# Patient Record
Sex: Male | Born: 1940 | Race: White | Hispanic: No | Smoking: Never smoker
Health system: Southern US, Community
[De-identification: ages and names within clinical notes are randomized; demographics above are authoritative.]

## PROBLEM LIST (undated history)

## (undated) DIAGNOSIS — R0989 Other specified symptoms and signs involving the circulatory and respiratory systems: Secondary | ICD-10-CM

## (undated) DIAGNOSIS — G4730 Sleep apnea, unspecified: Secondary | ICD-10-CM

## (undated) DIAGNOSIS — R209 Unspecified disturbances of skin sensation: Secondary | ICD-10-CM

## (undated) DIAGNOSIS — Z86010 Personal history of colonic polyps: Secondary | ICD-10-CM

## (undated) DIAGNOSIS — G20 Parkinson's disease: Secondary | ICD-10-CM

## (undated) DIAGNOSIS — I82409 Acute embolism and thrombosis of unspecified deep veins of unspecified lower extremity: Secondary | ICD-10-CM

## (undated) DIAGNOSIS — R0609 Other forms of dyspnea: Secondary | ICD-10-CM

## (undated) DIAGNOSIS — Z7901 Long term (current) use of anticoagulants: Secondary | ICD-10-CM

## (undated) DIAGNOSIS — G609 Hereditary and idiopathic neuropathy, unspecified: Secondary | ICD-10-CM

## (undated) DIAGNOSIS — Z9889 Other specified postprocedural states: Secondary | ICD-10-CM

## (undated) DIAGNOSIS — IMO0001 Reserved for inherently not codable concepts without codable children: Secondary | ICD-10-CM

## (undated) DIAGNOSIS — M109 Gout, unspecified: Secondary | ICD-10-CM

## (undated) DIAGNOSIS — M79609 Pain in unspecified limb: Secondary | ICD-10-CM

## (undated) DIAGNOSIS — R32 Unspecified urinary incontinence: Secondary | ICD-10-CM

## (undated) DIAGNOSIS — N400 Enlarged prostate without lower urinary tract symptoms: Secondary | ICD-10-CM

## (undated) DIAGNOSIS — E7800 Pure hypercholesterolemia, unspecified: Secondary | ICD-10-CM

## (undated) DIAGNOSIS — I6529 Occlusion and stenosis of unspecified carotid artery: Secondary | ICD-10-CM

## (undated) DIAGNOSIS — Z973 Presence of spectacles and contact lenses: Secondary | ICD-10-CM

## (undated) DIAGNOSIS — E1165 Type 2 diabetes mellitus with hyperglycemia: Secondary | ICD-10-CM

## (undated) DIAGNOSIS — R6889 Other general symptoms and signs: Secondary | ICD-10-CM

## (undated) DIAGNOSIS — I10 Essential (primary) hypertension: Secondary | ICD-10-CM

## (undated) DIAGNOSIS — Z23 Encounter for immunization: Secondary | ICD-10-CM

## (undated) DIAGNOSIS — R195 Other fecal abnormalities: Secondary | ICD-10-CM

## (undated) DIAGNOSIS — R112 Nausea with vomiting, unspecified: Secondary | ICD-10-CM

## (undated) HISTORY — PX: SKIN BIOPSY: SHX1

## (undated) HISTORY — DX: Other specified symptoms and signs involving the circulatory and respiratory systems: R09.89

## (undated) HISTORY — DX: Unspecified disturbances of skin sensation: R20.9

## (undated) HISTORY — DX: Essential (primary) hypertension: I10

## (undated) HISTORY — DX: Long term (current) use of anticoagulants: Z79.01

## (undated) HISTORY — DX: Gout, unspecified: M10.9

## (undated) HISTORY — DX: Personal history of colonic polyps: Z86.010

## (undated) HISTORY — DX: Other specified symptoms and signs involving the circulatory and respiratory systems: R06.09

## (undated) HISTORY — DX: Pain in unspecified limb: M79.609

## (undated) HISTORY — DX: Hereditary and idiopathic neuropathy, unspecified: G60.9

## (undated) HISTORY — DX: Benign prostatic hyperplasia without lower urinary tract symptoms: N40.0

## (undated) HISTORY — DX: Other general symptoms and signs: R68.89

## (undated) HISTORY — DX: Unspecified urinary incontinence: R32

## (undated) HISTORY — DX: Other fecal abnormalities: R19.5

## (undated) HISTORY — DX: Encounter for immunization: Z23

## (undated) HISTORY — PX: LITHOTRIPSY: SUR834

## (undated) HISTORY — DX: Acute embolism and thrombosis of unspecified deep veins of unspecified lower extremity: I82.409

## (undated) HISTORY — DX: Parkinson's disease: G20

## (undated) HISTORY — DX: Pure hypercholesterolemia, unspecified: E78.00

## (undated) HISTORY — DX: Reserved for inherently not codable concepts without codable children: IMO0001

## (undated) HISTORY — DX: Type 2 diabetes mellitus with hyperglycemia: E11.65

## (undated) HISTORY — DX: Occlusion and stenosis of unspecified carotid artery: I65.29

---

## 1998-12-08 HISTORY — PX: CYSTOSCOPY W/ STONE MANIPULATION: SHX1427

## 2007-06-08 HISTORY — PX: UMBILICAL HERNIA REPAIR: SHX196

## 2008-04-24 ENCOUNTER — Encounter: Admission: RE | Admit: 2008-04-24 | Discharge: 2008-04-24 | Payer: Self-pay | Admitting: Family Medicine

## 2008-10-18 ENCOUNTER — Encounter: Admission: RE | Admit: 2008-10-18 | Discharge: 2008-10-18 | Payer: Self-pay | Admitting: Family Medicine

## 2009-04-17 ENCOUNTER — Encounter: Admission: RE | Admit: 2009-04-17 | Discharge: 2009-04-17 | Payer: Self-pay | Admitting: Family Medicine

## 2009-12-08 HISTORY — PX: LUMBAR SPINE SURGERY: SHX701

## 2010-09-13 ENCOUNTER — Inpatient Hospital Stay (HOSPITAL_COMMUNITY): Admission: RE | Admit: 2010-09-13 | Discharge: 2010-09-20 | Payer: Self-pay | Admitting: Orthopaedic Surgery

## 2010-09-21 ENCOUNTER — Inpatient Hospital Stay (HOSPITAL_COMMUNITY): Admission: EM | Admit: 2010-09-21 | Discharge: 2010-09-26 | Payer: Self-pay | Admitting: Emergency Medicine

## 2011-02-19 LAB — BASIC METABOLIC PANEL
BUN: 11 mg/dL (ref 6–23)
BUN: 16 mg/dL (ref 6–23)
CO2: 22 mEq/L (ref 19–32)
CO2: 23 mEq/L (ref 19–32)
CO2: 23 mEq/L (ref 19–32)
CO2: 24 mEq/L (ref 19–32)
Calcium: 7.6 mg/dL — ABNORMAL LOW (ref 8.4–10.5)
Chloride: 104 mEq/L (ref 96–112)
Chloride: 98 mEq/L (ref 96–112)
Creatinine, Ser: 1.09 mg/dL (ref 0.4–1.5)
GFR calc Af Amer: >60 mL/min (ref >60)
GFR calc Af Amer: >60 mL/min (ref >60)
GFR calc non Af Amer: >60 mL/min (ref >60)
GFR calc non Af Amer: >60 mL/min (ref >60)
GFR calc non Af Amer: >60 mL/min (ref >60)
Glucose, Bld: 111 mg/dL — ABNORMAL HIGH (ref 70–99)
Glucose, Bld: 139 mg/dL — ABNORMAL HIGH (ref 70–99)
Glucose, Bld: 196 mg/dL — ABNORMAL HIGH (ref 70–99)
Potassium: 3 mEq/L — ABNORMAL LOW (ref 3.5–5.1)
Potassium: 3.6 mEq/L (ref 3.5–5.1)
Potassium: 3.7 mEq/L (ref 3.5–5.1)
Sodium: 128 mEq/L — ABNORMAL LOW (ref 135–145)
Sodium: 134 mEq/L — ABNORMAL LOW (ref 135–145)
Sodium: 135 mEq/L (ref 135–145)

## 2011-02-19 LAB — CBC
HCT: 31.2 % — ABNORMAL LOW (ref 39.0–52.0)
HCT: 31.9 % — ABNORMAL LOW (ref 39.0–52.0)
HCT: 32.7 % — ABNORMAL LOW (ref 39.0–52.0)
HCT: 33.3 % — ABNORMAL LOW (ref 39.0–52.0)
Hemoglobin: 10.8 g/dL — ABNORMAL LOW (ref 13.0–17.0)
Hemoglobin: 10.9 g/dL — ABNORMAL LOW (ref 13.0–17.0)
Hemoglobin: 11.2 g/dL — ABNORMAL LOW (ref 13.0–17.0)
Hemoglobin: 11.3 g/dL — ABNORMAL LOW (ref 13.0–17.0)
Hemoglobin: 11.4 g/dL — ABNORMAL LOW (ref 13.0–17.0)
MCH: 30.9 pg (ref 26.0–34.0)
MCH: 31.2 pg (ref 26.0–34.0)
MCH: 31.4 pg (ref 26.0–34.0)
MCH: 31.6 pg (ref 26.0–34.0)
MCHC: 34.2 g/dL (ref 30.0–36.0)
MCHC: 34.6 g/dL (ref 30.0–36.0)
MCHC: 34.6 g/dL (ref 30.0–36.0)
MCV: 90.7 fL (ref 78.0–100.0)
MCV: 90.8 fL (ref 78.0–100.0)
MCV: 92.2 fL (ref 78.0–100.0)
MCV: 92.2 fL (ref 78.0–100.0)
Platelets: 199 10*3/uL (ref 150–400)
Platelets: 210 10*3/uL (ref 150–400)
RBC: 3.46 MIL/uL — ABNORMAL LOW (ref 4.22–5.81)
RBC: 3.53 MIL/uL — ABNORMAL LOW (ref 4.22–5.81)
RBC: 3.6 MIL/uL — ABNORMAL LOW (ref 4.22–5.81)
RBC: 3.61 MIL/uL — ABNORMAL LOW (ref 4.22–5.81)
RDW: 13.4 % (ref 11.5–15.5)
RDW: 13.6 % (ref 11.5–15.5)
WBC: 17.5 10*3/uL — ABNORMAL HIGH (ref 4.0–10.5)
WBC: 18 10*3/uL — ABNORMAL HIGH (ref 4.0–10.5)
WBC: 6.1 10*3/uL (ref 4.0–10.5)
WBC: 9.2 10*3/uL (ref 4.0–10.5)

## 2011-02-19 LAB — CULTURE, BLOOD (ROUTINE X 2)
Culture  Setup Time: 201110160205
Culture  Setup Time: 201110160205
Culture  Setup Time: 201110181912
Culture: NO GROWTH
Culture: NO GROWTH
Culture: NO GROWTH

## 2011-02-19 LAB — PROTIME-INR
INR: 1.67 — ABNORMAL HIGH (ref 0.00–1.49)
INR: 1.72 — ABNORMAL HIGH (ref 0.00–1.49)
INR: 2.17 — ABNORMAL HIGH (ref 0.00–1.49)
Prothrombin Time: 19.9 seconds — ABNORMAL HIGH (ref 11.6–15.2)
Prothrombin Time: 20.3 seconds — ABNORMAL HIGH (ref 11.6–15.2)
Prothrombin Time: 24.5 seconds — ABNORMAL HIGH (ref 11.6–15.2)
Prothrombin Time: 24.9 seconds — ABNORMAL HIGH (ref 11.6–15.2)

## 2011-02-19 LAB — COMPREHENSIVE METABOLIC PANEL
ALT: 42 U/L (ref 0–53)
AST: 43 U/L — ABNORMAL HIGH (ref 0–37)
Albumin: 2.6 g/dL — ABNORMAL LOW (ref 3.5–5.2)
Alkaline Phosphatase: 57 U/L (ref 39–117)
BUN: 20 mg/dL (ref 6–23)
CO2: 23 mEq/L (ref 19–32)
Calcium: 7.9 mg/dL — ABNORMAL LOW (ref 8.4–10.5)
Chloride: 99 mEq/L (ref 96–112)
Creatinine, Ser: 1.02 mg/dL (ref 0.4–1.5)
GFR calc Af Amer: >60 mL/min (ref >60)
GFR calc non Af Amer: >60 mL/min (ref >60)
Glucose, Bld: 198 mg/dL — ABNORMAL HIGH (ref 70–99)
Potassium: 3.1 mEq/L — ABNORMAL LOW (ref 3.5–5.1)
Sodium: 130 mEq/L — ABNORMAL LOW (ref 135–145)
Total Bilirubin: 1 mg/dL (ref 0.3–1.2)
Total Protein: 5.4 g/dL — ABNORMAL LOW (ref 6.0–8.3)

## 2011-02-19 LAB — GLUCOSE, CAPILLARY
Glucose-Capillary: 107 mg/dL — ABNORMAL HIGH (ref 70–99)
Glucose-Capillary: 112 mg/dL — ABNORMAL HIGH (ref 70–99)
Glucose-Capillary: 124 mg/dL — ABNORMAL HIGH (ref 70–99)
Glucose-Capillary: 137 mg/dL — ABNORMAL HIGH (ref 70–99)
Glucose-Capillary: 147 mg/dL — ABNORMAL HIGH (ref 70–99)
Glucose-Capillary: 161 mg/dL — ABNORMAL HIGH (ref 70–99)
Glucose-Capillary: 168 mg/dL — ABNORMAL HIGH (ref 70–99)
Glucose-Capillary: 173 mg/dL — ABNORMAL HIGH (ref 70–99)
Glucose-Capillary: 237 mg/dL — ABNORMAL HIGH (ref 70–99)
Glucose-Capillary: 77 mg/dL (ref 70–99)

## 2011-02-19 LAB — DIFFERENTIAL
Basophils Absolute: 0 10*3/uL (ref 0.0–0.1)
Basophils Relative: 0 % (ref 0–1)
Eosinophils Absolute: 0 10*3/uL (ref 0.0–0.7)
Eosinophils Relative: 0 % (ref 0–5)
Lymphocytes Relative: 2 % — ABNORMAL LOW (ref 12–46)
Lymphs Abs: 0.4 10*3/uL — ABNORMAL LOW (ref 0.7–4.0)
Monocytes Absolute: 0.7 10*3/uL (ref 0.1–1.0)
Monocytes Relative: 4 % (ref 3–12)
Neutro Abs: 17 10*3/uL — ABNORMAL HIGH (ref 1.7–7.7)
Neutrophils Relative %: 94 % — ABNORMAL HIGH (ref 43–77)

## 2011-02-19 LAB — URINALYSIS, ROUTINE W REFLEX MICROSCOPIC
Bilirubin Urine: NEGATIVE
Glucose, UA: 100 mg/dL — AB
Ketones, ur: NEGATIVE mg/dL
Nitrite: NEGATIVE
Protein, ur: NEGATIVE mg/dL
Specific Gravity, Urine: 1.024 (ref 1.005–1.030)
Urobilinogen, UA: 1 mg/dL (ref 0.0–1.0)
pH: 5.5 (ref 5.0–8.0)

## 2011-02-19 LAB — URINE CULTURE
Colony Count: >=100000
Culture  Setup Time: 201110151917

## 2011-02-19 LAB — URINE MICROSCOPIC-ADD ON

## 2011-02-20 LAB — TYPE AND SCREEN
ABO/RH(D): O POS
Antibody Screen: NEGATIVE

## 2011-02-20 LAB — GLUCOSE, CAPILLARY
Glucose-Capillary: 154 mg/dL — ABNORMAL HIGH (ref 70–99)
Glucose-Capillary: 157 mg/dL — ABNORMAL HIGH (ref 70–99)
Glucose-Capillary: 162 mg/dL — ABNORMAL HIGH (ref 70–99)
Glucose-Capillary: 165 mg/dL — ABNORMAL HIGH (ref 70–99)
Glucose-Capillary: 167 mg/dL — ABNORMAL HIGH (ref 70–99)
Glucose-Capillary: 172 mg/dL — ABNORMAL HIGH (ref 70–99)
Glucose-Capillary: 176 mg/dL — ABNORMAL HIGH (ref 70–99)
Glucose-Capillary: 178 mg/dL — ABNORMAL HIGH (ref 70–99)
Glucose-Capillary: 188 mg/dL — ABNORMAL HIGH (ref 70–99)
Glucose-Capillary: 188 mg/dL — ABNORMAL HIGH (ref 70–99)
Glucose-Capillary: 206 mg/dL — ABNORMAL HIGH (ref 70–99)
Glucose-Capillary: 207 mg/dL — ABNORMAL HIGH (ref 70–99)
Glucose-Capillary: 237 mg/dL — ABNORMAL HIGH (ref 70–99)

## 2011-02-20 LAB — ABO/RH: ABO/RH(D): O POS

## 2011-02-20 LAB — COMPREHENSIVE METABOLIC PANEL
ALT: 21 U/L (ref 0–53)
AST: 46 U/L — ABNORMAL HIGH (ref 0–37)
Albumin: 4.2 g/dL (ref 3.5–5.2)
Alkaline Phosphatase: 65 U/L (ref 39–117)
BUN: 21 mg/dL (ref 6–23)
Chloride: 101 mEq/L (ref 96–112)
Potassium: 4.1 mEq/L (ref 3.5–5.1)
Sodium: 138 mEq/L (ref 135–145)
Total Bilirubin: 1 mg/dL (ref 0.3–1.2)
Total Protein: 7.1 g/dL (ref 6.0–8.3)

## 2011-02-20 LAB — APTT: aPTT: 31 seconds (ref 24–37)

## 2011-02-20 LAB — POCT I-STAT 4, (NA,K, GLUC, HGB,HCT)
HCT: 30 % — ABNORMAL LOW (ref 39.0–52.0)
Potassium: 3.1 mEq/L — ABNORMAL LOW (ref 3.5–5.1)
Sodium: 140 mEq/L (ref 135–145)

## 2011-02-20 LAB — CBC
HCT: 46 % (ref 39.0–52.0)
Hemoglobin: 11.6 g/dL — ABNORMAL LOW (ref 13.0–17.0)
Hemoglobin: 16.6 g/dL (ref 13.0–17.0)
MCH: 33.2 pg (ref 26.0–34.0)
MCHC: 36.1 g/dL — ABNORMAL HIGH (ref 30.0–36.0)
MCV: 92 fL (ref 78.0–100.0)
Platelets: 137 10*3/uL — ABNORMAL LOW (ref 150–400)
Platelets: 188 10*3/uL (ref 150–400)
RBC: 3.61 MIL/uL — ABNORMAL LOW (ref 4.22–5.81)
RBC: 5 MIL/uL (ref 4.22–5.81)
RDW: 13.3 % (ref 11.5–15.5)
WBC: 11 10*3/uL — ABNORMAL HIGH (ref 4.0–10.5)
WBC: 5.2 10*3/uL (ref 4.0–10.5)

## 2011-02-20 LAB — DIFFERENTIAL
Basophils Absolute: 0 10*3/uL (ref 0.0–0.1)
Basophils Relative: 0 % (ref 0–1)
Eosinophils Absolute: 0 10*3/uL (ref 0.0–0.7)
Eosinophils Relative: 0 % (ref 0–5)
Lymphocytes Relative: 25 % (ref 12–46)
Lymphs Abs: 1.3 10*3/uL (ref 0.7–4.0)
Monocytes Absolute: 0.4 10*3/uL (ref 0.1–1.0)
Monocytes Relative: 7 % (ref 3–12)
Neutro Abs: 3.5 10*3/uL (ref 1.7–7.7)
Neutrophils Relative %: 68 % (ref 43–77)

## 2011-02-20 LAB — BASIC METABOLIC PANEL
BUN: 19 mg/dL (ref 6–23)
Calcium: 7.8 mg/dL — ABNORMAL LOW (ref 8.4–10.5)
Chloride: 101 mEq/L (ref 96–112)
Chloride: 104 mEq/L (ref 96–112)
Chloride: 105 mEq/L (ref 96–112)
Creatinine, Ser: 0.82 mg/dL (ref 0.4–1.5)
Creatinine, Ser: 0.87 mg/dL (ref 0.4–1.5)
Creatinine, Ser: 0.98 mg/dL (ref 0.4–1.5)
GFR calc Af Amer: >60 mL/min (ref >60)
GFR calc Af Amer: >60 mL/min (ref >60)
GFR calc Af Amer: >60 mL/min (ref >60)
GFR calc non Af Amer: >60 mL/min (ref >60)
Potassium: 2.9 mEq/L — ABNORMAL LOW (ref 3.5–5.1)
Potassium: 3.3 mEq/L — ABNORMAL LOW (ref 3.5–5.1)
Sodium: 136 mEq/L (ref 135–145)
Sodium: 137 mEq/L (ref 135–145)
Sodium: 139 mEq/L (ref 135–145)

## 2011-02-20 LAB — PROTIME-INR
INR: 1.18 (ref 0.00–1.49)
INR: 1.35 (ref 0.00–1.49)
INR: 1.45 (ref 0.00–1.49)
INR: 1.67 — ABNORMAL HIGH (ref 0.00–1.49)
Prothrombin Time: 15.2 seconds (ref 11.6–15.2)
Prothrombin Time: 16.9 seconds — ABNORMAL HIGH (ref 11.6–15.2)
Prothrombin Time: 17.8 seconds — ABNORMAL HIGH (ref 11.6–15.2)

## 2011-02-20 LAB — URINALYSIS, ROUTINE W REFLEX MICROSCOPIC
Glucose, UA: NEGATIVE mg/dL
Hgb urine dipstick: NEGATIVE
Protein, ur: NEGATIVE mg/dL
Specific Gravity, Urine: 1.018 (ref 1.005–1.030)
pH: 7 (ref 5.0–8.0)

## 2011-02-20 LAB — HEMOGLOBIN A1C: Hgb A1c MFr Bld: 6.5 % — ABNORMAL HIGH (ref <5.7)

## 2011-02-20 LAB — MAGNESIUM: Magnesium: 2 mg/dL (ref 1.5–2.5)

## 2011-02-20 LAB — HEMOGLOBIN AND HEMATOCRIT, BLOOD: HCT: 31.1 % — ABNORMAL LOW (ref 39.0–52.0)

## 2011-03-05 ENCOUNTER — Other Ambulatory Visit: Payer: Self-pay | Admitting: Oncology

## 2011-03-05 ENCOUNTER — Encounter (HOSPITAL_BASED_OUTPATIENT_CLINIC_OR_DEPARTMENT_OTHER): Payer: Medicare Other | Admitting: Oncology

## 2011-03-05 DIAGNOSIS — I749 Embolism and thrombosis of unspecified artery: Secondary | ICD-10-CM

## 2011-03-05 LAB — CBC WITH DIFFERENTIAL/PLATELET
EOS%: 0.2 % (ref 0.0–7.0)
MCH: 30.8 pg (ref 27.2–33.4)
MCV: 91.2 fL (ref 79.3–98.0)
MONO%: 8.4 % (ref 0.0–14.0)
RBC: 5.14 10*6/uL (ref 4.20–5.82)
RDW: 15.9 % — ABNORMAL HIGH (ref 11.0–14.6)

## 2011-03-06 ENCOUNTER — Encounter: Payer: Self-pay | Admitting: Oncology

## 2011-03-06 LAB — COMPREHENSIVE METABOLIC PANEL
AST: 36 U/L (ref 0–37)
Albumin: 4.3 g/dL (ref 3.5–5.2)
Alkaline Phosphatase: 82 U/L (ref 39–117)
BUN: 20 mg/dL (ref 6–23)
Potassium: 3.6 mEq/L (ref 3.5–5.3)
Sodium: 140 mEq/L (ref 135–145)
Total Protein: 6.6 g/dL (ref 6.0–8.3)

## 2011-03-18 ENCOUNTER — Encounter (HOSPITAL_BASED_OUTPATIENT_CLINIC_OR_DEPARTMENT_OTHER): Payer: Medicare Other | Admitting: Oncology

## 2011-03-18 DIAGNOSIS — Z7901 Long term (current) use of anticoagulants: Secondary | ICD-10-CM

## 2011-03-18 DIAGNOSIS — I749 Embolism and thrombosis of unspecified artery: Secondary | ICD-10-CM

## 2011-04-03 IMAGING — CR DG CHEST 2V
2 series · 2 of 2 positions shown · non-contrast
Comparison: None.

CLINICAL DATA: Lumbar spine stenosis.  Hypertension.  Preop
respiratory exam.

CHEST - 2 VIEW

[view not recorded (1 of 2)]
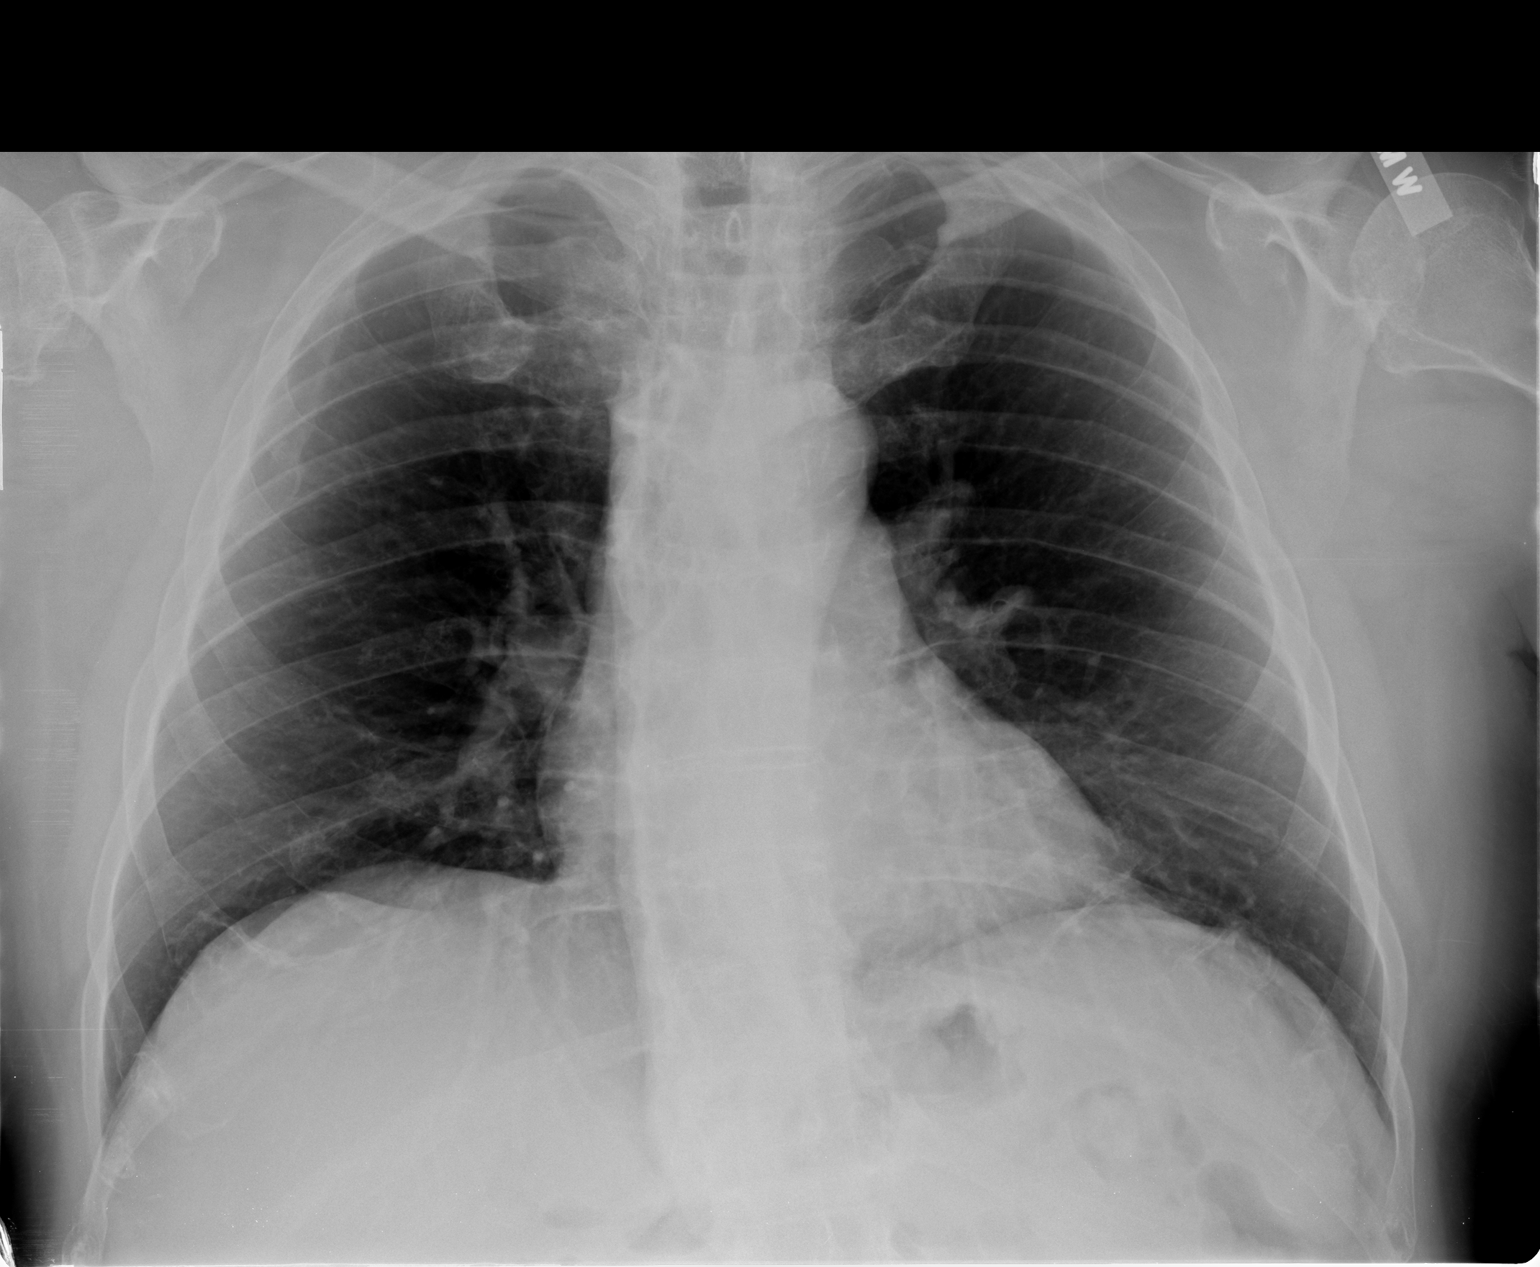

[view not recorded (2 of 2)]
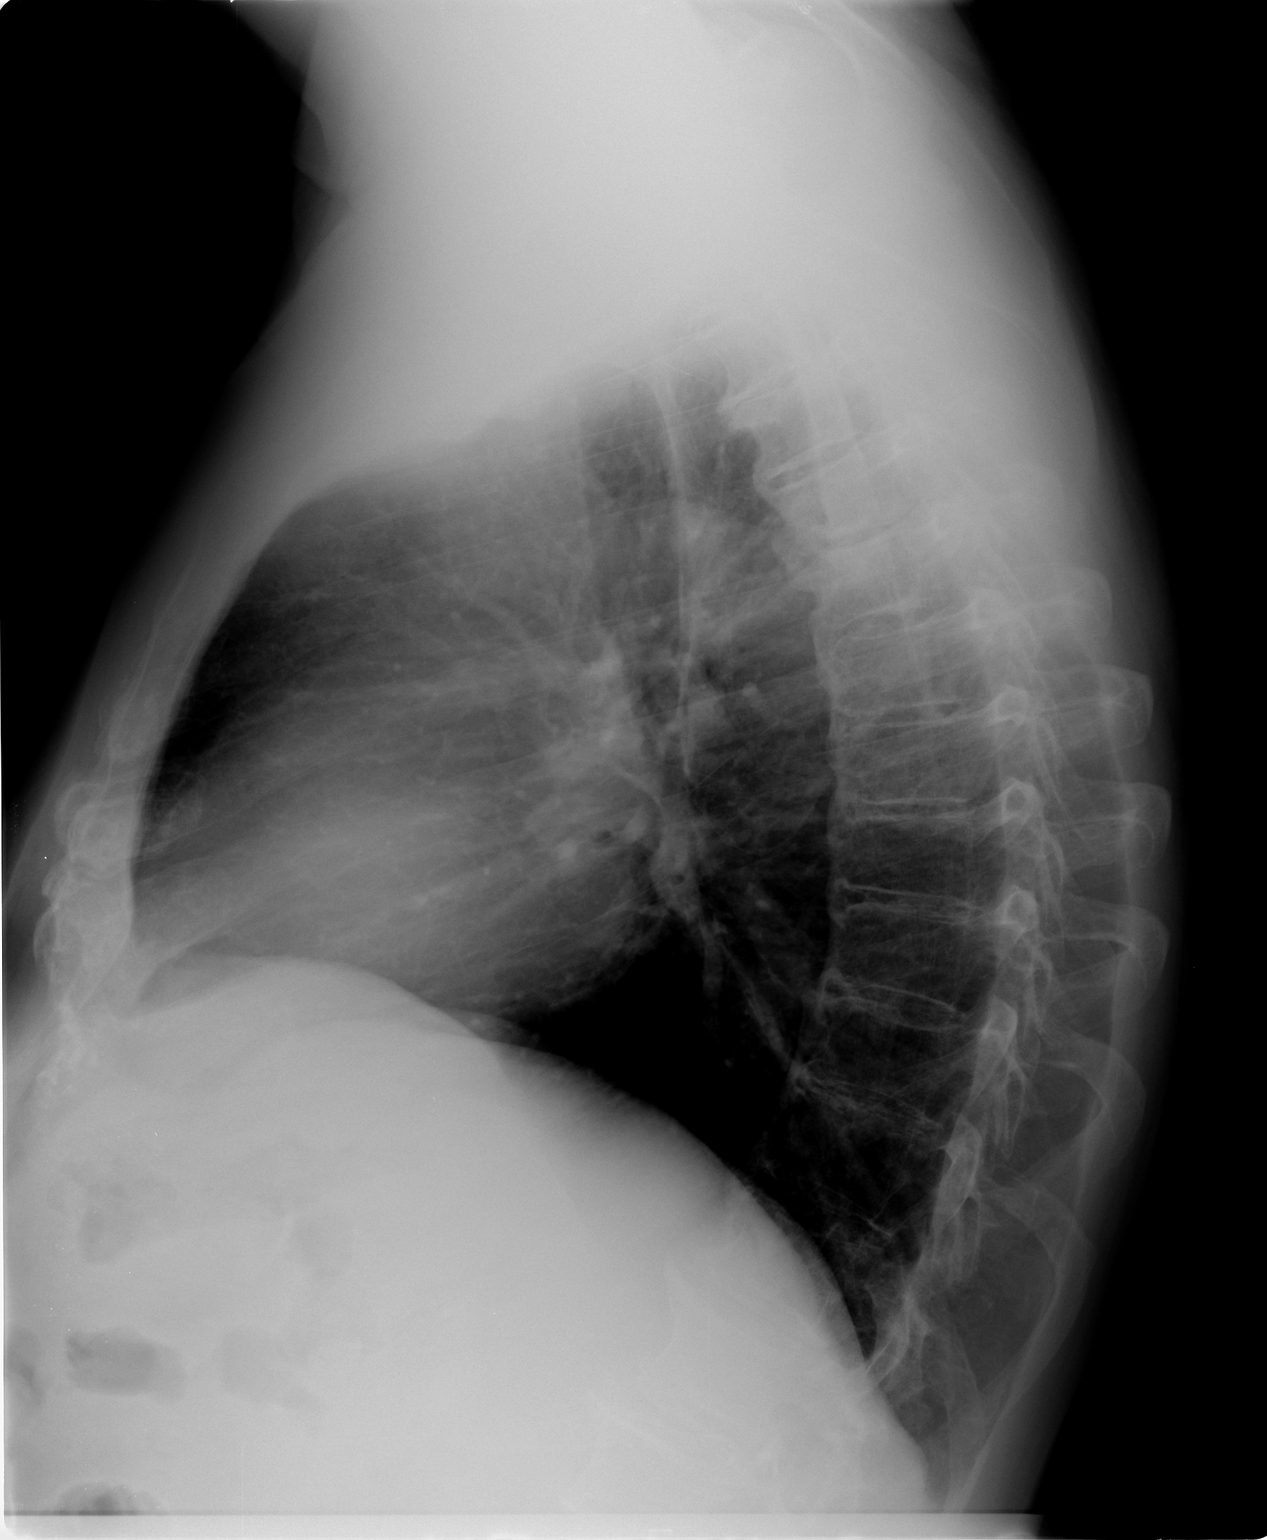

[2 of 2 positions shown; findings below may reference images not displayed]

FINDINGS: The heart size and mediastinal contours are within
normal limits.  Both lungs are clear.  The visualized skeletal
structures are unremarkable.
IMPRESSION: No active cardiopulmonary disease.

## 2011-04-29 HISTORY — PX: COLONOSCOPY: SHX174

## 2013-02-15 ENCOUNTER — Other Ambulatory Visit: Payer: Self-pay | Admitting: Family Medicine

## 2013-02-15 DIAGNOSIS — R0989 Other specified symptoms and signs involving the circulatory and respiratory systems: Secondary | ICD-10-CM

## 2013-02-21 ENCOUNTER — Ambulatory Visit
Admission: RE | Admit: 2013-02-21 | Discharge: 2013-02-21 | Disposition: A | Discharge status: Home / Self Care | Payer: Medicare Other | Source: Ambulatory Visit | Attending: Family Medicine | Admitting: Family Medicine

## 2013-02-21 DIAGNOSIS — R0989 Other specified symptoms and signs involving the circulatory and respiratory systems: Secondary | ICD-10-CM

## 2013-08-10 ENCOUNTER — Other Ambulatory Visit: Payer: Self-pay | Admitting: Family

## 2013-08-10 DIAGNOSIS — E041 Nontoxic single thyroid nodule: Secondary | ICD-10-CM

## 2013-08-11 ENCOUNTER — Ambulatory Visit
Admission: RE | Admit: 2013-08-11 | Discharge: 2013-08-11 | Disposition: A | Discharge status: Home / Self Care | Payer: Medicare Other | Source: Ambulatory Visit | Attending: Family | Admitting: Family

## 2013-08-11 DIAGNOSIS — E041 Nontoxic single thyroid nodule: Secondary | ICD-10-CM

## 2013-08-16 ENCOUNTER — Other Ambulatory Visit: Payer: Self-pay | Admitting: Family Medicine

## 2013-08-16 DIAGNOSIS — E041 Nontoxic single thyroid nodule: Secondary | ICD-10-CM

## 2013-08-17 ENCOUNTER — Ambulatory Visit
Admission: RE | Admit: 2013-08-17 | Discharge: 2013-08-17 | Disposition: A | Discharge status: Home / Self Care | Payer: Medicare Other | Source: Ambulatory Visit | Attending: Family Medicine | Admitting: Family Medicine

## 2013-08-17 ENCOUNTER — Other Ambulatory Visit (HOSPITAL_COMMUNITY)
Admission: RE | Admit: 2013-08-17 | Discharge: 2013-08-17 | Disposition: A | Discharge status: Home / Self Care | Payer: Medicare Other | Source: Ambulatory Visit | Attending: Interventional Radiology | Admitting: Interventional Radiology

## 2013-08-17 DIAGNOSIS — E041 Nontoxic single thyroid nodule: Secondary | ICD-10-CM

## 2013-09-14 IMAGING — US US CAROTID DUPLEX BILAT
1 series · 13 of 24 positions shown · non-contrast
Comparison: Head CT - 09/16/2010

CLINICAL DATA: Bilateral carotid bruit, history of hypertension,
diabetes

BILATERAL CAROTID DUPLEX ULTRASOUND
TECHNIQUE: Gray scale imaging, color Doppler and duplex ultrasound
was performed of bilateral carotid and vertebral arteries in the
neck.

[Series 1: us carotid duplex bilat · 0.09mm/px · 13 of 68 slices shown]
[im 1/68]
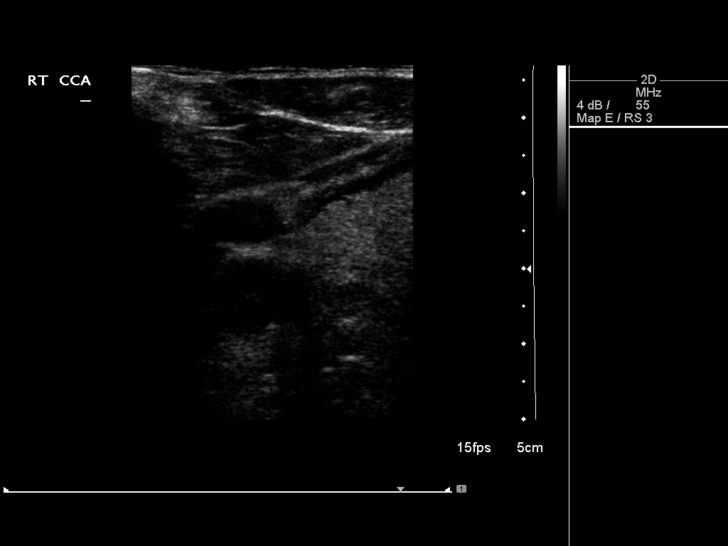
[im 6/68]
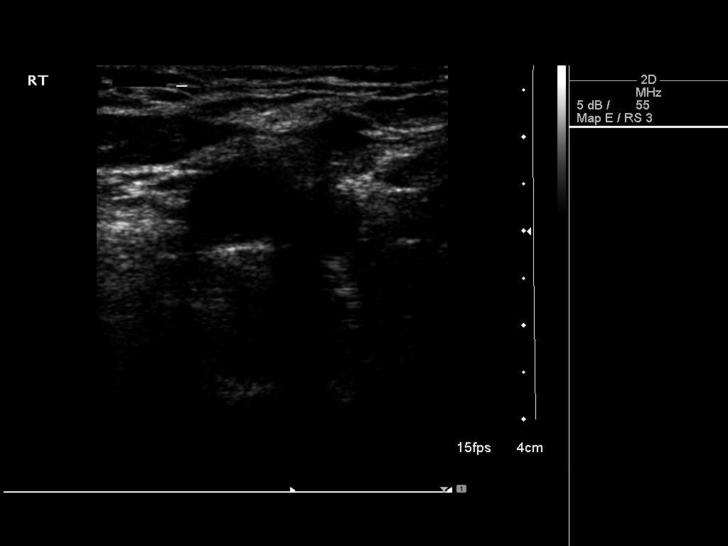
[im 12/68]
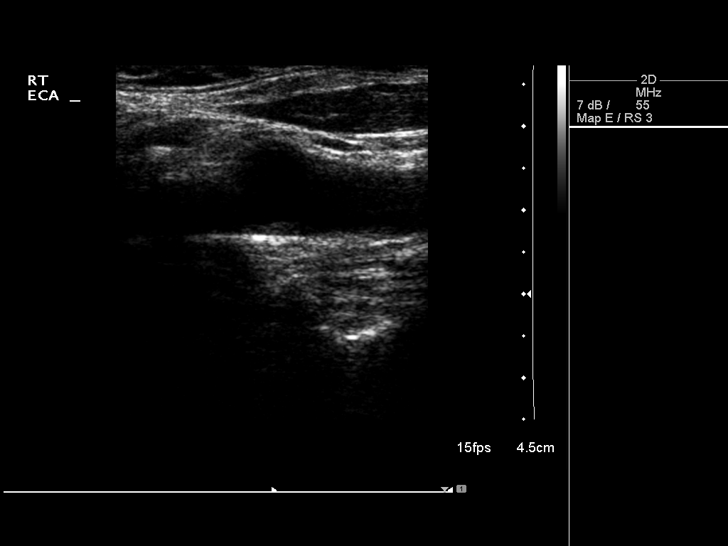
[im 18/68]
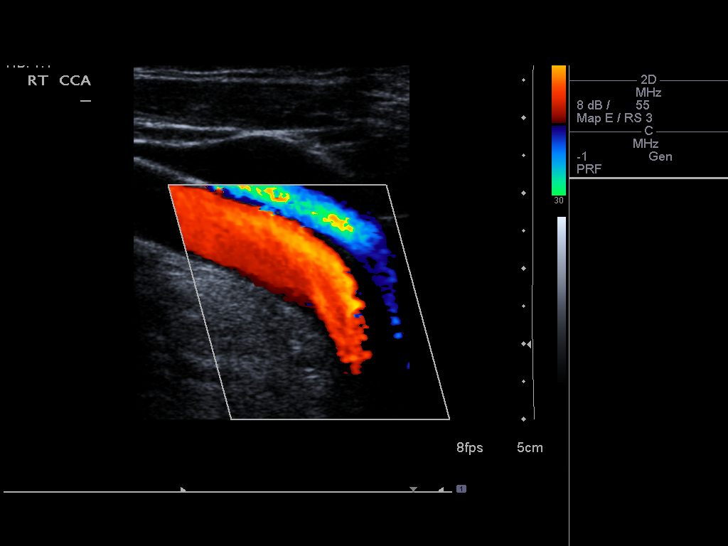
[im 24/68]
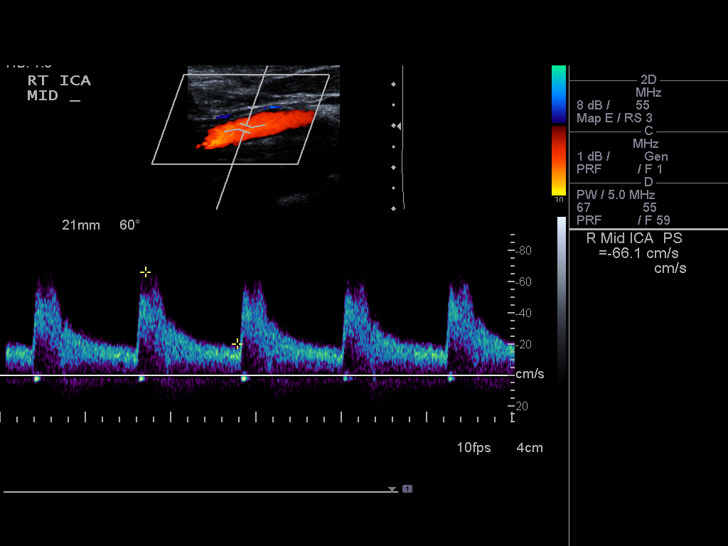
[im 30/68]
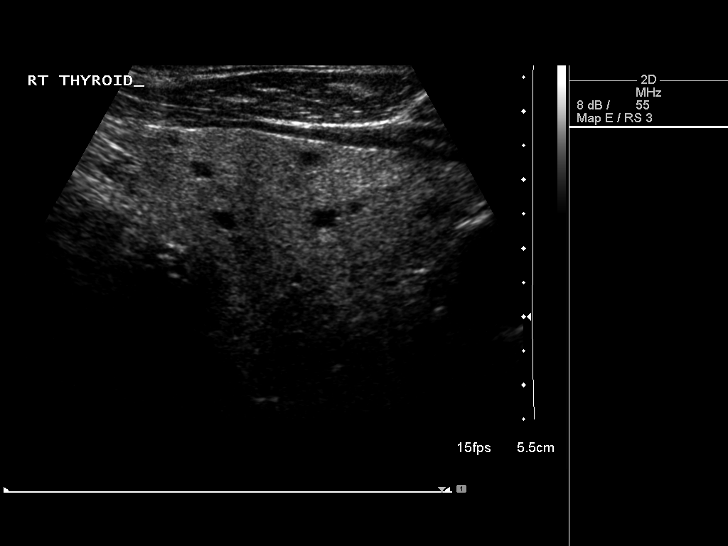
[im 35/68]
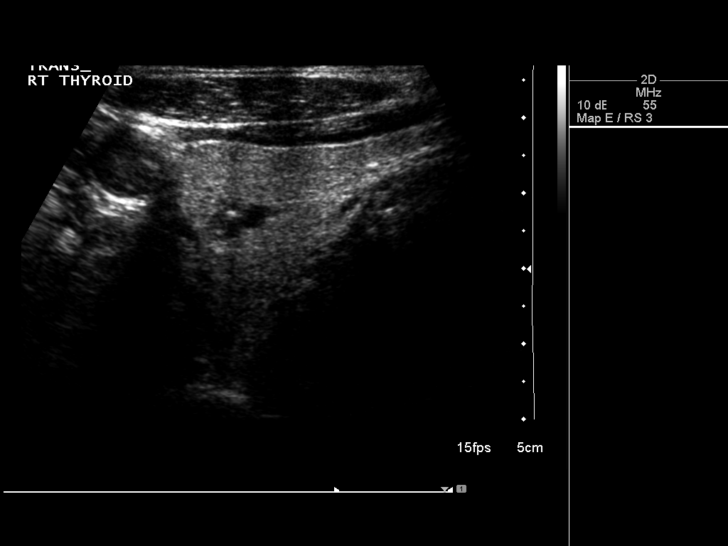
[im 38/68]
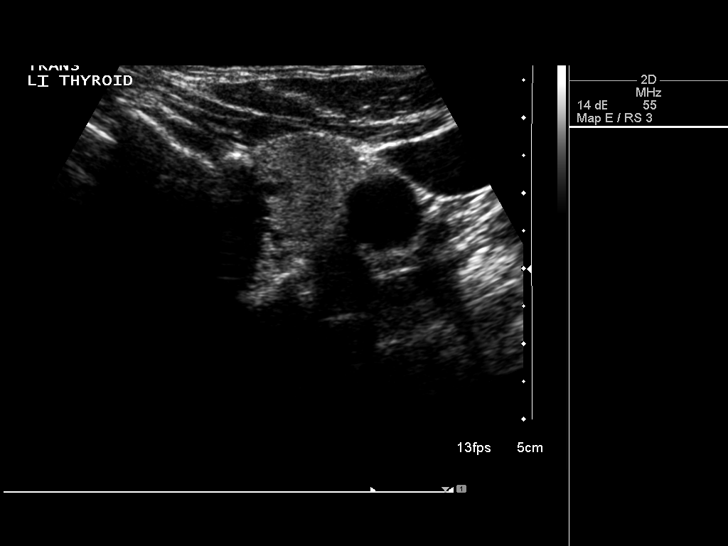
[im 44/68]
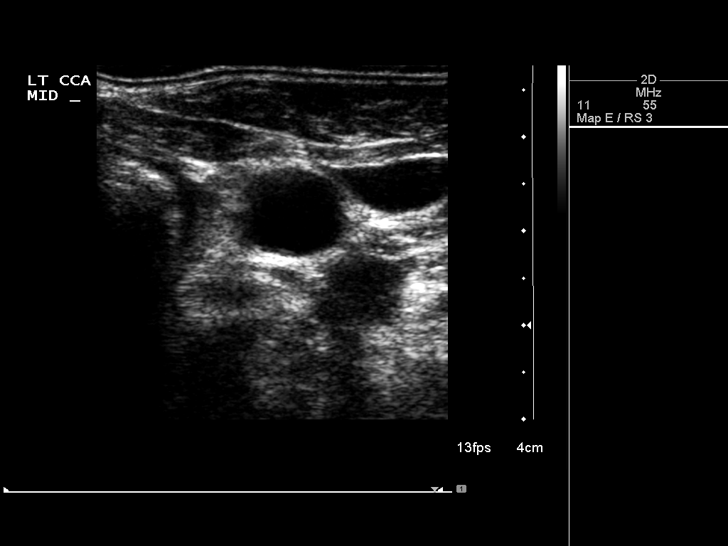
[im 50/68]
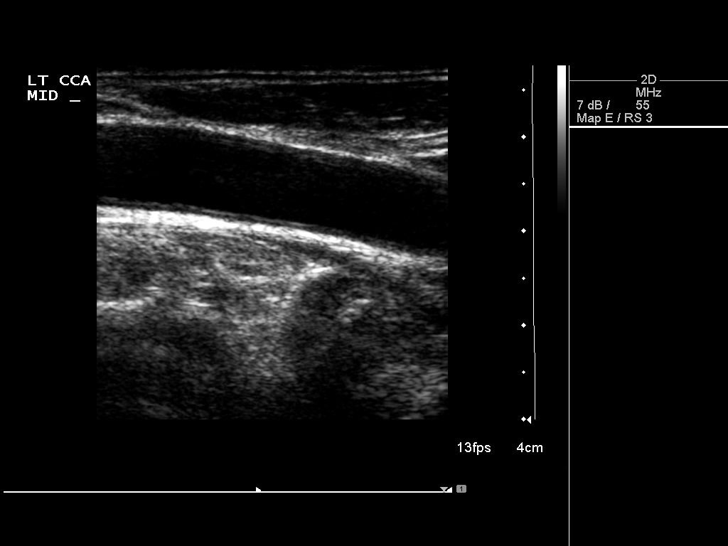
[im 56/68]
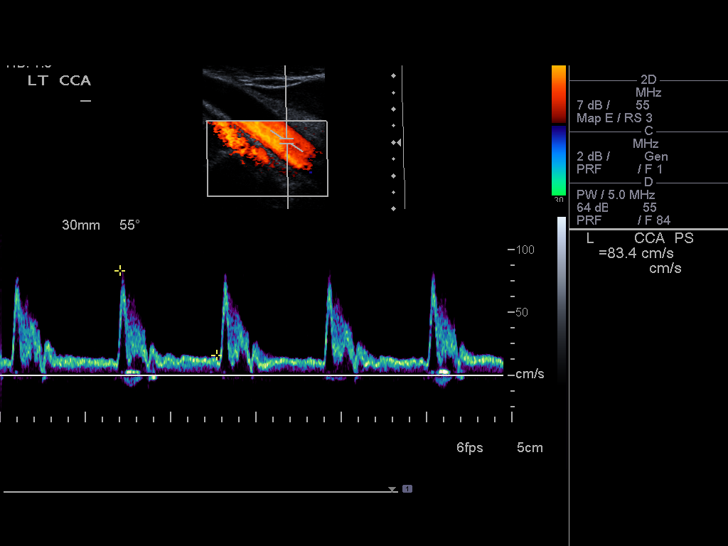
[im 62/68]
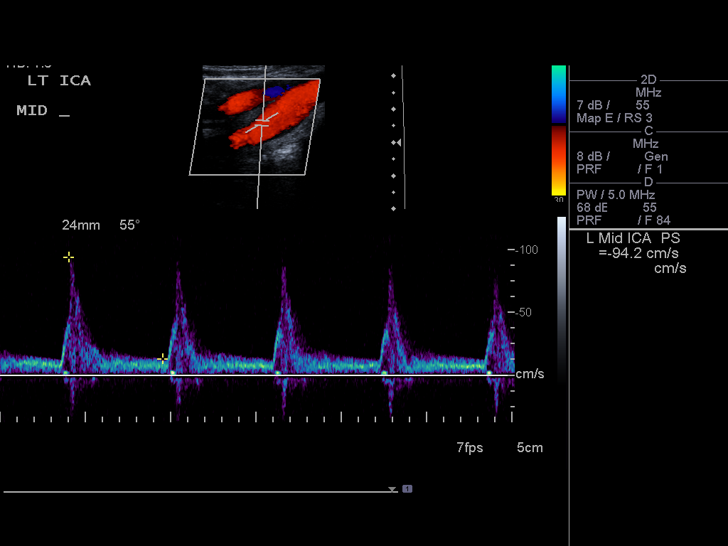
[im 68/68]
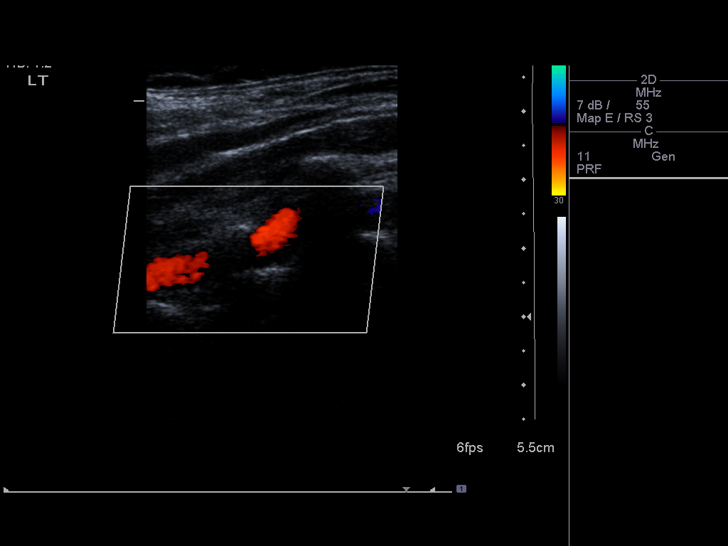

[13 of 24 positions shown; findings below may reference images not displayed]

Criteria:  Quantification of carotid stenosis is based on velocity
parameters that correlate the residual internal carotid diameter
with NASCET-based stenosis levels, using the diameter of the distal
internal carotid lumen as the denominator for stenosis measurement.

The following velocity measurements were obtained:

                 PEAK SYSTOLIC/END DIASTOLIC
RIGHT
ICA:                        67/10cm/sec
CCA:                        75/12cm/sec
SYSTOLIC ICA/CCA RATIO:
DIASTOLIC ICA/CCA RATIO:
ECA:                        93cm/sec

LEFT
ICA:                        102/15cm/sec
CCA:                        97/15cm/sec
SYSTOLIC ICA/CCA RATIO:
DIASTOLIC ICA/CCA RATIO:
ECA:                        88cm/sec
FINDINGS: RIGHT CAROTID ARTERY: There is a minimal amount of eccentric mixed
echogenic plaque within the right carotid bulb (image 14) extending
to involve the origin and proximal aspect of the right internal
carotid artery (image 17), not resulting elevated peak systolic
velocity in the right internal carotid artery to suggest a
hemodynamically significant narrowing

RIGHT VERTEBRAL ARTERY:  Antegrade flow

LEFT CAROTID ARTERY: There is no gray-scale evidence of significant
atherosclerotic plaque within the interrogated portions of the left
carotid system.  There are no elevated peak systolic velocities
within the left internal carotid artery to suggest a
hemodynamically significant stenosis.

LEFT VERTEBRAL ARTERY:  Antegrade flow

Incidental note is made of several sub centimeter anechoic cysts
within the right lobe of the thyroid, the largest of which measures
approximately 0.4 x 0.4 cm (images 32 - 34).  Two similar appearing
sub centimeter lesions are seen within the left lobe the thyroid
(45).
IMPRESSION: 1.  Right-sided atherosclerotic plaque does not result in
hemodynamically significant narrowing.
2.  Normal sonographic evaluation of the left carotid system.
3.  Scattered bilateral sub centimeter thyroid cysts, the largest
of which with the right lobe measures 0.4 cm in diameter.  Further
evaluation with dedicated thyroid ultrasound may be performed as
clinically indicated.

## 2014-02-23 ENCOUNTER — Encounter (INDEPENDENT_AMBULATORY_CARE_PROVIDER_SITE_OTHER): Payer: Self-pay

## 2014-02-23 ENCOUNTER — Encounter: Payer: Self-pay | Admitting: Neurology

## 2014-02-23 ENCOUNTER — Ambulatory Visit (INDEPENDENT_AMBULATORY_CARE_PROVIDER_SITE_OTHER): Payer: Medicare Other | Admitting: Neurology

## 2014-02-23 VITALS — BP 154/68 | HR 55 | Temp 98.7°F | Ht 65.25 in | Wt 175.0 lb

## 2014-02-23 DIAGNOSIS — G609 Hereditary and idiopathic neuropathy, unspecified: Secondary | ICD-10-CM

## 2014-02-23 DIAGNOSIS — M545 Low back pain, unspecified: Secondary | ICD-10-CM

## 2014-02-23 DIAGNOSIS — M79642 Pain in left hand: Secondary | ICD-10-CM

## 2014-02-23 DIAGNOSIS — M79609 Pain in unspecified limb: Secondary | ICD-10-CM

## 2014-02-23 DIAGNOSIS — G20 Parkinson's disease: Secondary | ICD-10-CM

## 2014-02-23 HISTORY — DX: Parkinson's disease: G20

## 2014-02-23 NOTE — Progress Notes (Signed)
Subjective:    Patient ID: NIKOLAS CASHER is a 73 y.o. male.  HPI    Huston Foley, MD, PhD [facility] [address], [address] [address] [place], [place] [address]  Dear Victorino Dike,  I saw your patient, Jasir Rother, upon your kind request in my neurologic clinic today for initial consultation of his gait and posture abnormalities, concern for parkinsonism. The patient is unaccompanied today. As you know, Mr. 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 In the past few months he has noted stiffness, abnormalities in his gait including shorter steps and abnormality in his posture with forward leaning and reduced right arm swing. He has not noticed any tremors. He denies being on neuroleptic medication or lithium in the past, exposure to herbicides and pesticides, family history of Parkinson's disease, memory loss, mood disorder, or dream enactments.  There is no FHx of PD. He had exposure to [facility], pesticide as a teenager on a tobacco plant.  At the time of his lumbar spine surgery in October 2011 he had some confusion and side effects from morphine and had a head CT: Nonspecific asymmetric white matter hypodensity, greater on the left. Most commonly this reflects small vessel ischemia. No acute intracranial abnormality.  His Past Medical History Is Significant For: Past Medical History  Diagnosis Date  . Hypertrophy of prostate without urinary obstruction and other lower urinary tract symptoms (LUTS)   . Long term (current) use of anticoagulants   . Gout, unspecified   . Pure  hypercholesterolemia   . Essential hypertension, benign   . Unspecified hereditary and idiopathic peripheral neuropathy   . Nonspecific abnormal finding in stool contents   . Acute venous embolism and thrombosis of unspecified deep vessels of lower extremity   . Personal history of colonic polyps   . Unspecified essential hypertension   . Occlusion and stenosis of carotid artery without mention of cerebral infarction   . Need for prophylactic vaccination and inoculation against influenza   . Type II or unspecified type diabetes mellitus without mention of complication, uncontrolled   . Other general symptoms(780.99)     rigidity  . Pain in limb   . Other dyspnea and respiratory abnormality   . Disturbance of skin sensation     Left hand numbness  . Unspecified urinary incontinence   . Unspecified essential hypertension     His Past Surgical History Is Significant For: Past Surgical History  Procedure Laterality Date  . Umbilical hernia repair  06/2007  . Skin biopsy      multiple  . Lumbar spine surgery  2011  . Colonoscopy  04/29/2011    His Family History Is Significant For: Family History  Problem Relation Age of Onset  . Cancer Mother   . Cancer Father   . Cancer Sister   . Cancer Brother   . Cancer Maternal Grandmother     His Social History Is Significant For: History   Social History  . Marital Status: Widowed    Spouse Name: N/A    Number of Children: N/A  . Years of Education: N/A   Social History Main Topics  . Smoking status: Never Smoker   .  Smokeless tobacco: None  . Alcohol Use: No  . Drug Use: No  . Sexual Activity: None   Other Topics Concern  . None   Social History Narrative  . None    His Allergies Are:  Allergies  Allergen Reactions  . Morphine And Related Other (See Comments)    Hallucinations; makes him "crazy"  :   His Current Medications Are:  Outpatient Encounter Prescriptions as of 02/23/2014  Medication Sig  .  allopurinol (ZYLOPRIM) 300 MG tablet Take 300 mg by mouth daily.  . benazepril (LOTENSIN) 10 MG tablet Take 1 tablet by mouth daily.  . Cholecalciferol (VITAMIN D) 2000 UNITS tablet Take 2,000 Units by mouth daily.  . metFORMIN (GLUCOPHAGE) 500 MG tablet Take 2 tablets by mouth daily.  . Multiple Vitamin (MULTIVITAMIN) capsule Take 1 capsule by mouth daily.  . tamsulosin (FLOMAX) 0.4 MG CAPS capsule Take 2 capsules by mouth daily.  : Review of Systems:  Out of a complete 14 point review of systems, all are reviewed and negative with the exception of these symptoms as listed below:  Review of Systems  Constitutional: Negative.   HENT: Negative.   Eyes: Negative.   Respiratory: Positive for apnea (snoring).   Cardiovascular: Positive for leg swelling (left).  Gastrointestinal:       Incontinence  Endocrine: Negative.   Genitourinary: Positive for enuresis and difficulty urinating.  Musculoskeletal: Negative.   Skin: Negative.   Allergic/Immunologic: Negative.   Neurological: Positive for numbness.       Memory loss  Hematological: Negative.   Psychiatric/Behavioral: Positive for sleep disturbance (snoring).    Objective:  Neurologic Exam  Physical Exam Physical Examination:   Filed Vitals:   02/23/14 1402  BP: 154/68  Pulse: 55  Temp: 98.7 F (37.1 C)   General Examination: The patient is a very pleasant 73 y.o. male in no acute distress.  HEENT: Normocephalic, atraumatic, pupils are equal, round and reactive to light and accommodation. Congenital blindness OS. Funduscopic exam is normal with sharp disc margins noted. Extraocular tracking shows mild saccadic breakdown without nystagmus noted. There is limitation to upper gaze. There is mild decrease in eye blink rate. Hearing is intact. Tympanic membranes are clear bilaterally. Face is symmetric with minimal facial masking and normal facial sensation. There is no lip, neck or jaw tremor. Neck is moderately rigid with intact  passive ROM. There are no carotid bruits on auscultation. Oropharynx exam reveals mild mouth dryness. No significant airway crowding is noted. Mallampati is class II. Tongue protrudes centrally and palate elevates symmetrically.    Chest: is clear to auscultation without wheezing, rhonchi or crackles noted.  Heart: sounds are regular and normal without murmurs, rubs or gallops noted.   Abdomen: is soft, non-tender and non-distended with normal bowel sounds appreciated on auscultation.  Extremities: There is trace pitting edema in the distal lower extremities bilaterally. Pedal pulses are intact. Chronic stasis-like changes are noted in the distal legs bilaterally. There are no varicose veins.  Skin: is warm and dry with no trophic changes noted. Age-related changes are noted on the skin.   Musculoskeletal: exam reveals no obvious joint deformities, tenderness, joint swelling or erythema.  Neurologically:  Mental status: The patient is awake and alert, paying good  attention. He is able to completely provide the history. His son provides details the entire history. He is oriented to: person, place, time/date, situation, day of week, month of year and year. His memory, attention, language and knowledge are intact. There is  no aphasia, agnosia, apraxia or anomia. There is a mild degree of bradyphrenia. Speech is mildly hypophonic with no dysarthria noted. Mood is congruent and affect is normal.  Cranial nerves are as described above under HEENT exam. In addition, shoulder shrug is unequal shoulder height noted, L shoulder is lower than R.  Motor exam: Normal bulk, and strength for age is noted. There are no dyskinesias noted. Tone is mildly rigid with presence of cogwheeling in the right upper extremity. There is overall mild bradykinesia. There is no drift or rebound.  There is no tremor.   Romberg is negative.  Reflexes are 1+ in the upper extremities and none in the lower extremities. Toes are  downgoing bilaterally.  Fine motor skills exam: Finger taps are mildly impaired on the right and minimally impaired on the left. Hand movements are mildly impaired on the right and minimally impaired on the left. RAP (rapid alternating patting) is mildly impaired on the right and minimally impaired on the left. Foot taps are mildly impaired on the right and minimally  impaired on the left. Foot agility (in the form of heel stomping) is mildly impaired on the right and minimally impaired on the left.    Cerebellar testing shows no dysmetria or intention tremor on finger to nose testing. Heel to shin is unremarkable bilaterally. There is no truncal or gait ataxia.   Sensory exam is intact to light touch, pinprick, vibration, temperature sense in the upper and lower extremities, with the exception of a mild decrease in vibration and pinprick sensation in the distal lower extremities.    Gait, station and balance: He stands up from the seated position with moderate difficulty and does need to push up with His hands. He needs no assistance. No veering to one side is noted. He is not noted to lean to the side. Posture is mildly stooped, advanced for age. Stance is wide-based. He walks with decrease in stride length and pace and decreased arm swing on the right. He turns in 3 steps. Tandem walk is not possible. Balance is mildly impaired. He is not able to do a toe or heel stance.     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 He has left hand pain but does not have any sensory loss in his hand. I do not know how this all ties and with his other symptoms. He did have a fall in January and they are not completely sure if the  symptoms started before or after his fall. He did not injure himself seriously at the time but did not seek medical attention either. I would like to proceed with a brain MRI. I had a long chat with the patient and his son about His symptoms, my findings and the diagnosis of parkinsonism/Parksinson's disease, its prognosis and treatment options. We talked about medical treatments and non-pharmacological approaches. We talked about maintaining a healthy lifestyle in general. I encouraged the patient to eat healthy, exercise daily and keep well hydrated, to keep a scheduled bedtime and wake time routine, to not skip any meals and eat healthy snacks in between meals and to have protein with every meal. In particular, I stressed the importance of regular exercise, within of course the  patient's own mobility limitations.   As far as further diagnostic testing is concerned, I suggested: no change. I think we ought to wait and monitor symptoms or little longer. He is not keen on starting any new medications at this time. I answered all their questions today and the patient and his son were in agreement with the above outlined plan. I would like to see the patient back in 6 months, sooner if the need arises and encouraged them to call with any interim questions, concerns, problems or updates. His son requested that we call him with the MRI results.   Thank you very much for allowing me to participate in the care of this nice patient. If I can be of any further assistance to you please do not hesitate to call me at [phone].  Sincerely,   Huston FoleySaima Dayla Gasca, MD, PhD

## 2014-02-23 NOTE — Patient Instructions (Addendum)
You do have mild signs of parkinsonism. I think overall you are doing fairly well but I do want to suggest a few things today:   Remember to drink plenty of fluid, eat healthy meals and do not skip any meals. Try to eat protein with a every meal and eat a healthy snack such as fruit or nuts in between meals. Try to keep a regular sleep-wake schedule and try to exercise daily, particularly in the form of walking, 20-30 minutes a day, if you can.   Engage in social activities in your community and with your family and try to keep up with current events by reading the newspaper or watching the news.   As far as your medications are concerned, I would like to suggest no medications.    As far as diagnostic testing: MRI brain without contrast.   I would like to see you back in 6 months, sooner if we need to. Please call us with any interim questions, concerns, problems, updates or refill requests.  Our nursing staff will answer any of your questions and relay your messages to me and also relay most of my messages to you.  Our phone number is [phone]. We also have an after hours call service for urgent matters and there is a physician on-call for urgent questions. For any emergencies you know to call 911 or go to the nearest emergency room.

## 2014-03-04 IMAGING — US US SOFT TISSUE HEAD/NECK
1 series · 14 of 25 positions shown · non-contrast
Comparison: Carotid Doppler 02/21/2013

***ADDENDUM*** CREATED: 08/16/2013 [DATE]

The following
In the findings has been corrected:
Focal nodules: A complex cystic lesion in the interpolar region of
the right lobe is stable at 9 x 6 x 7 mm. A solid nodule is NOW
visualized in the lower pole of the right lobe measuring 1.3 x
x 1.5 cm. 8 x 7 x 10 mm left interpolar region solid nodule.
***END ADDENDUM*** SIGNED BY: Ph Ahmed Shaath, M.D.
CLINICAL DATA: Thyroid abnormality noted on carotid ultrasound
THYROID ULTRASOUND
TECHNIQUE: Ultrasound examination of the thyroid gland and adjacent
soft tissues was performed.

[Series 1: us soft tissue head/neck · 0.09mm/px · 14 of 53 slices shown]
[im 1/53]
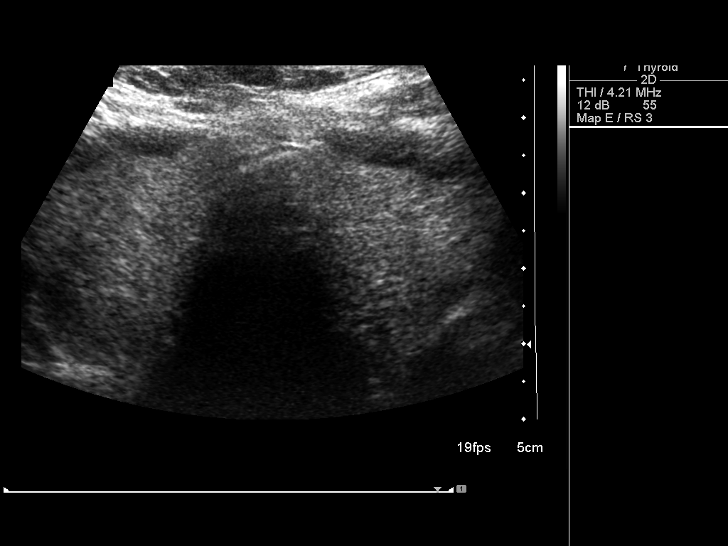
[im 5/53]
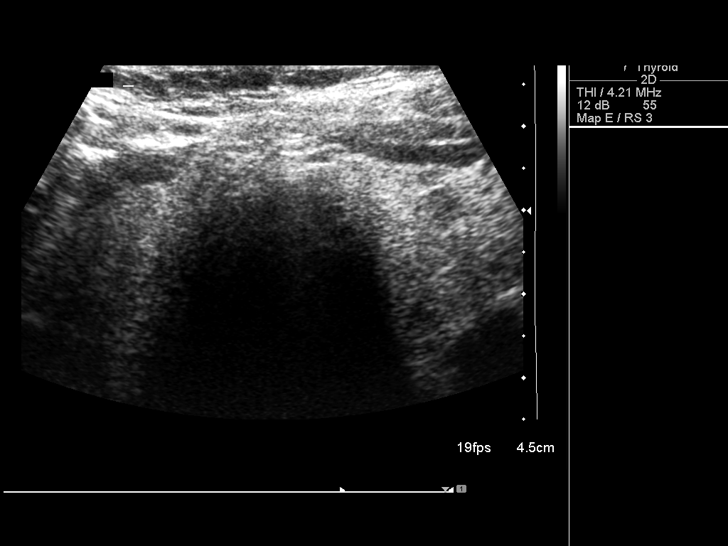
[im 9/53]
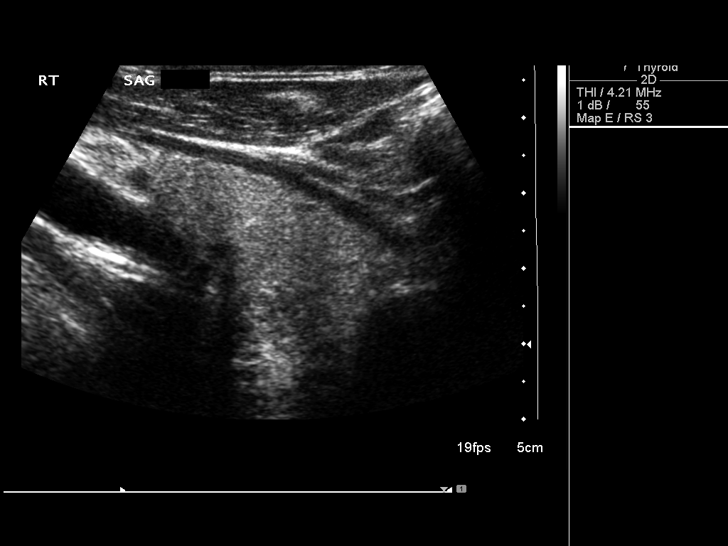
[im 14/53]
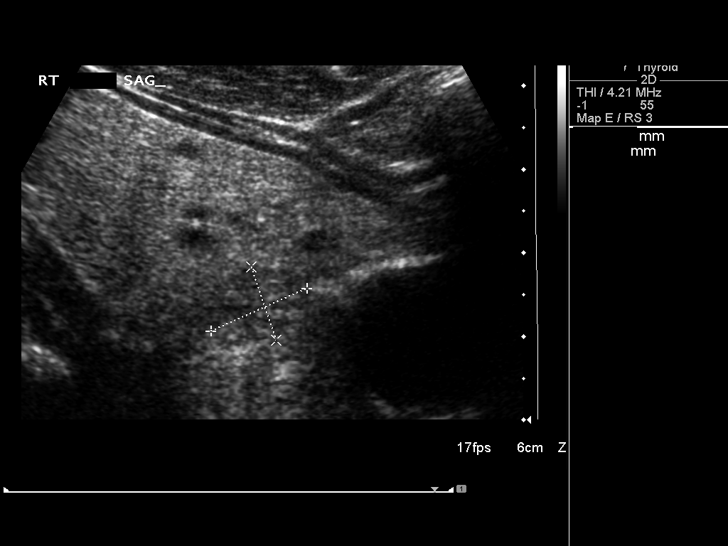
[im 18/53]
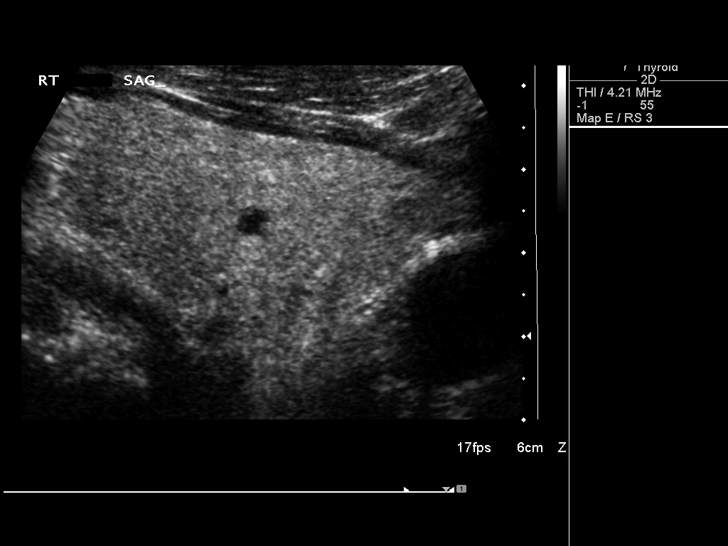
[im 20/53]
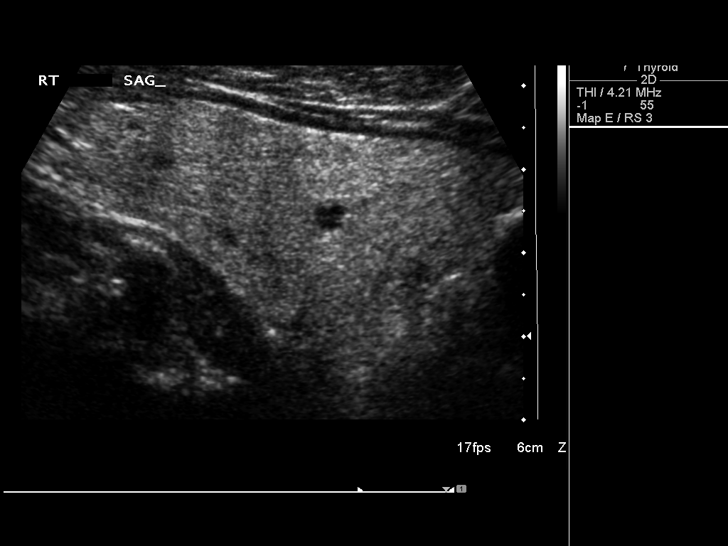
[im 24/53]
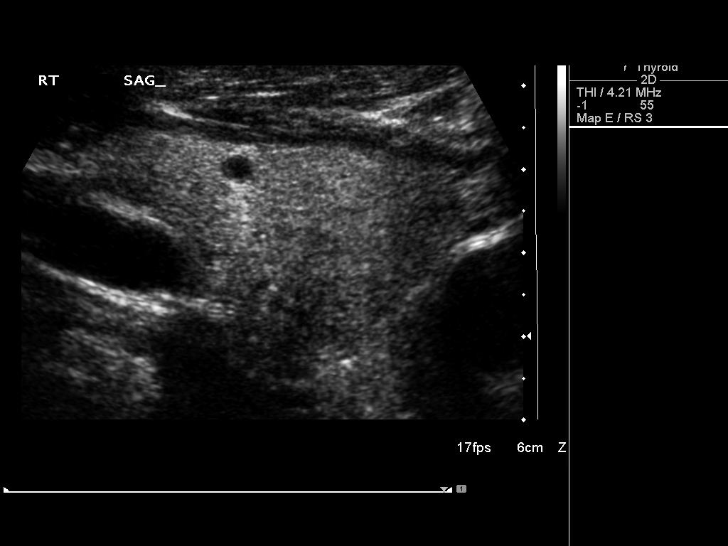
[im 29/53]
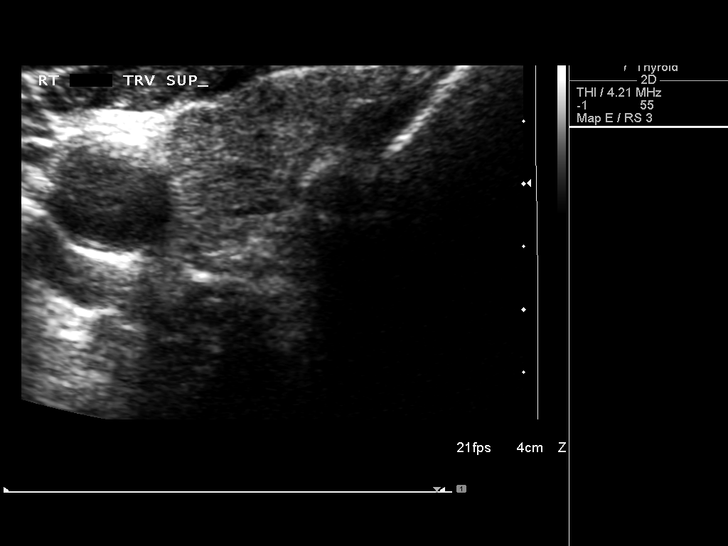
[im 33/53]
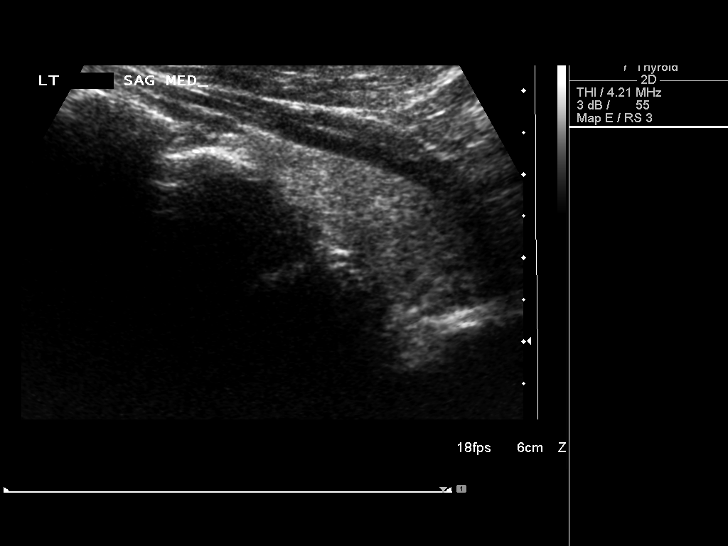
[im 35/53]
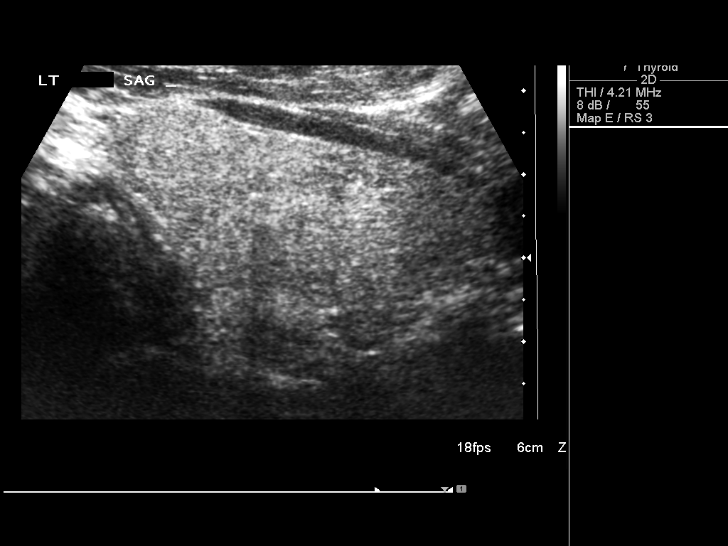
[im 40/53]
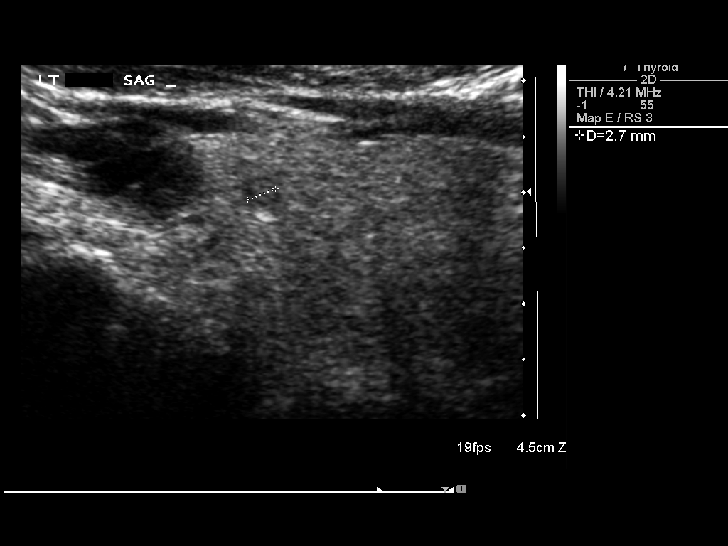
[im 44/53]
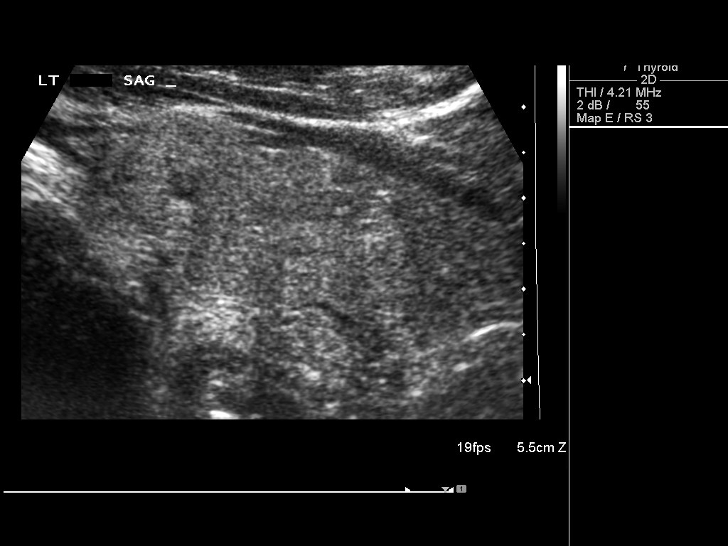
[im 48/53]
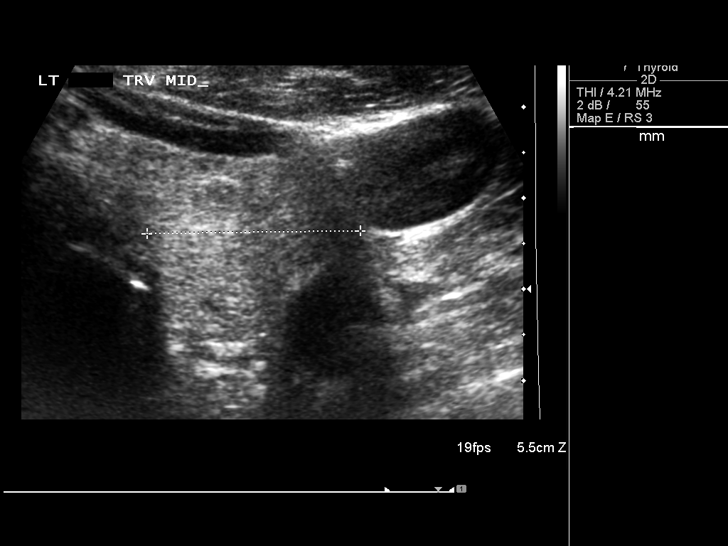
[im 53/53]
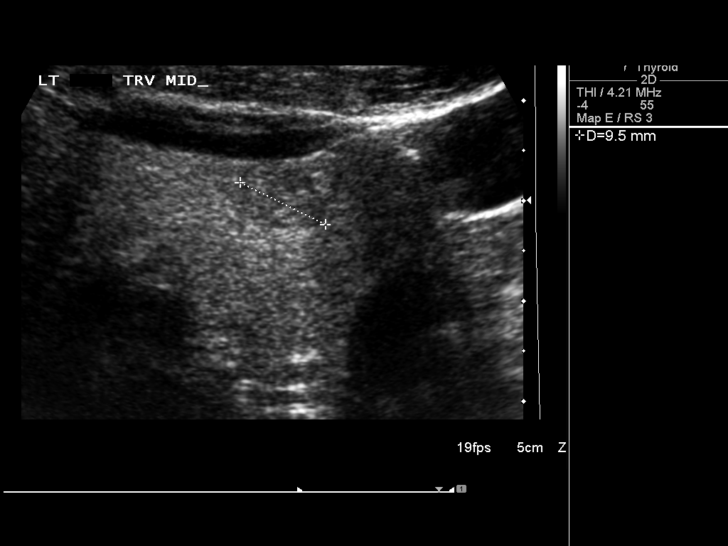

[14 of 25 positions shown; findings below may reference images not displayed]

FINDINGS: Right thyroid lobe:  5.5 x 2.9 x 2.4 cm.
Left thyroid lobe:  5.0 x 2.1 x 2.3 cm.
Isthmus:  7 mm in thickness.

Focal nodules:  A complex cystic lesion in the interpolar region of
the right lobe is stable at 9 x 6 x 7 mm.  A solid nodule is not
visualized in the lower pole of the right lobe measuring 1.3 x
x 1.5 cm.  8 x 7 x 10 mm left interpolar region solid nodule.

Lymphadenopathy:  None visualized.
IMPRESSION: Dominant nodule in the lower pole of the right lobe meets criteria
for fine needle aspiration biopsy.

## 2014-03-10 IMAGING — US US THYROID BIOPSY
1 series · 14 of 14 positions shown · non-contrast
Comparison: none

Clinical Data/Indication: Dominant right thyroid nodule

ULTRASOUND-GUIDED BIOPSY OF A DOMINANT RIGHT THYROID GLAND.  FINE
NEEDLE ASPIRATION.
Procedure: The procedure, risks, benefits, and alternatives were
explained to the patient. Questions regarding the procedure were
encouraged and answered. The patient understands and consents to
the procedure.
The then was prepped with betadine in a sterile fashion, and a
sterile drape was applied covering the operative field. A surgical
mass and sterile gloves were used for the procedure.
Under sonographic guidance, three 25 gauge fine needle aspirates of
the dominant right lower pole nodule were obtained. Final imaging
was performed.
Patient tolerated the procedure well without complication.

[Series 1: us thyroid biopsy · 0.08mm/px · 14 acquisitions, 14 frames shown]
[im 1/14]
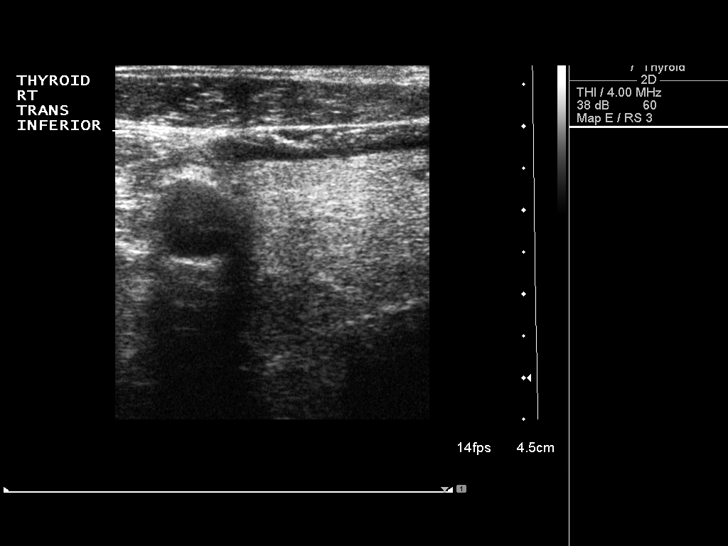
[im 2/14]
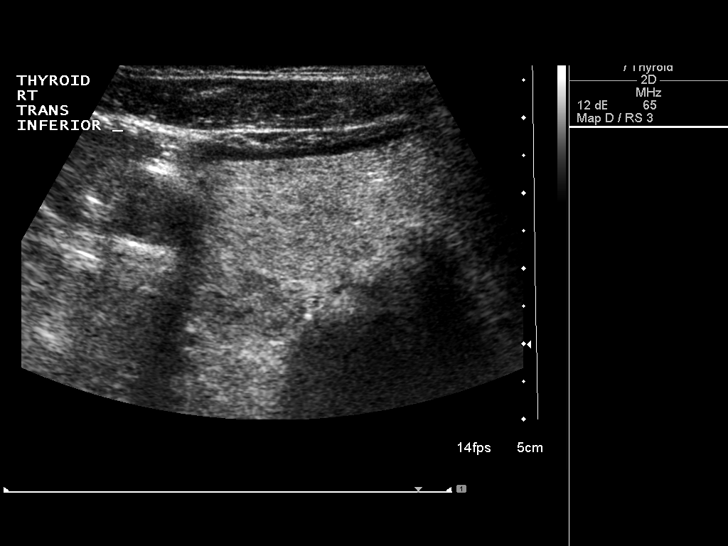
[im 3/14]
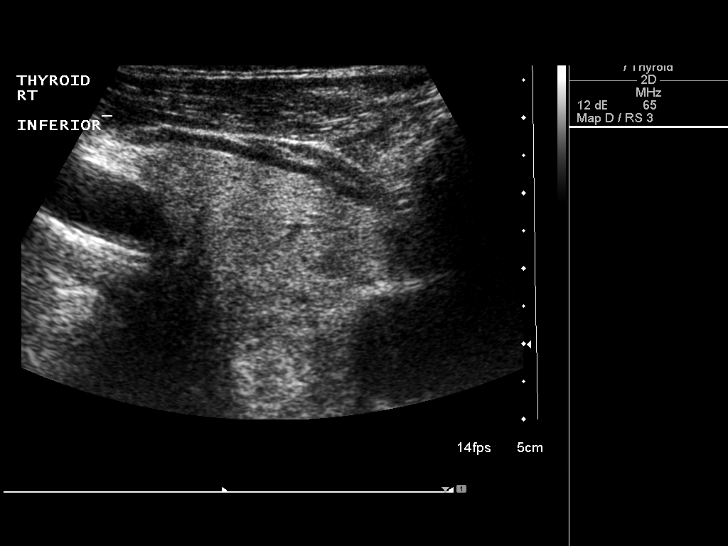
[im 4/14]
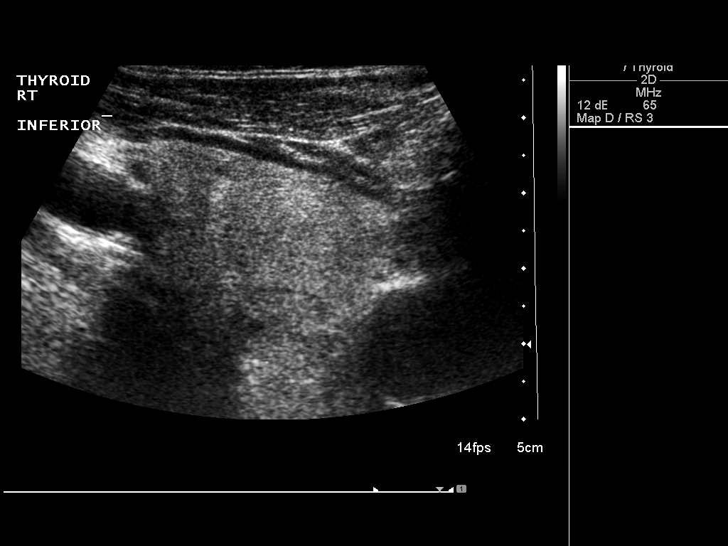
[im 5/14]
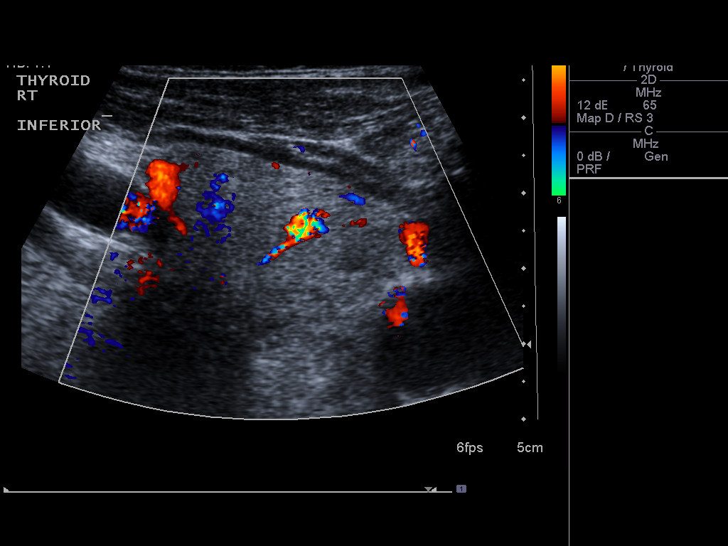
[im 6/14]
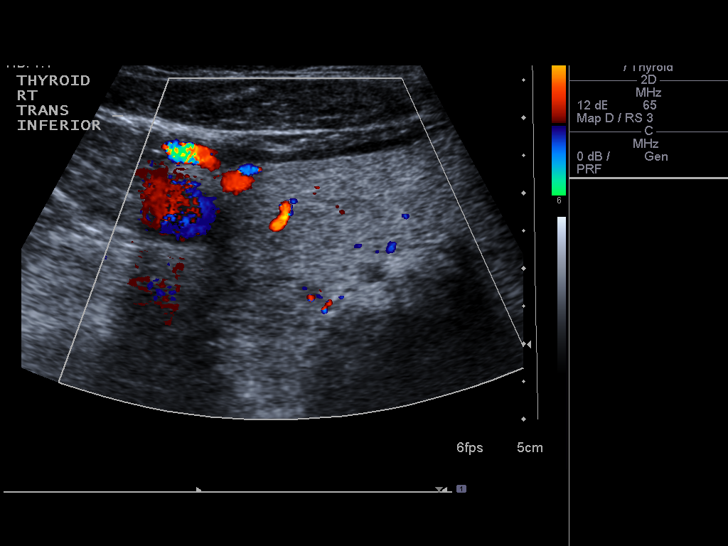
[im 7/14]
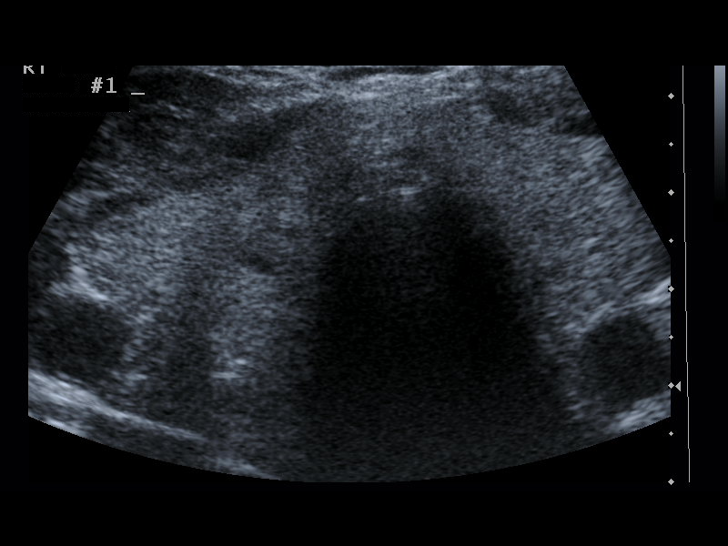
[im 8/14]
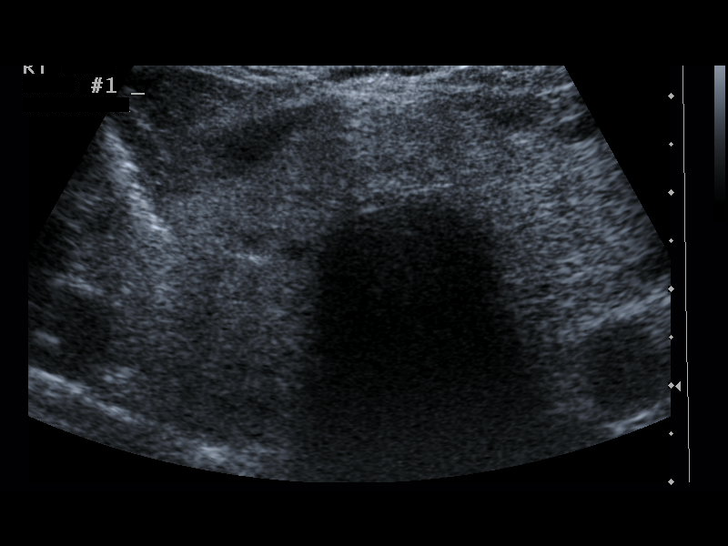
[im 9/14]
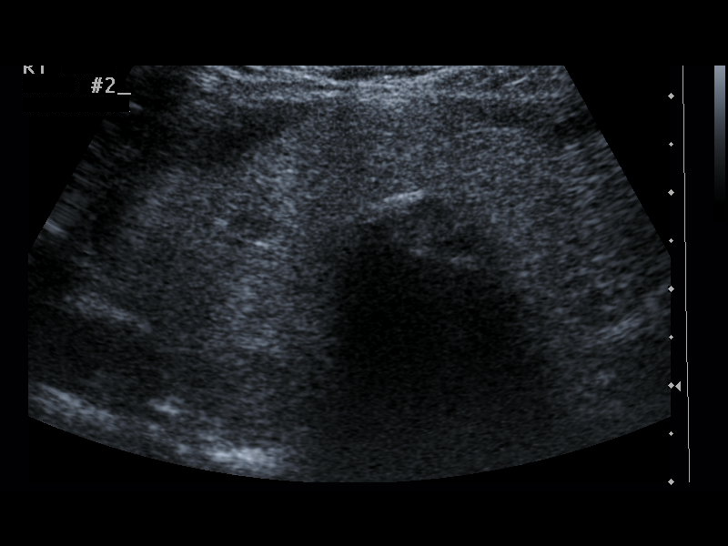
[im 10/14]
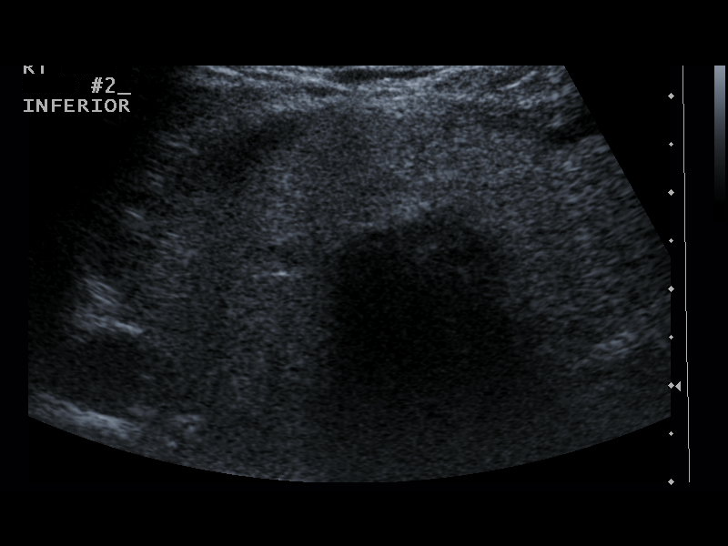
[im 11/14]
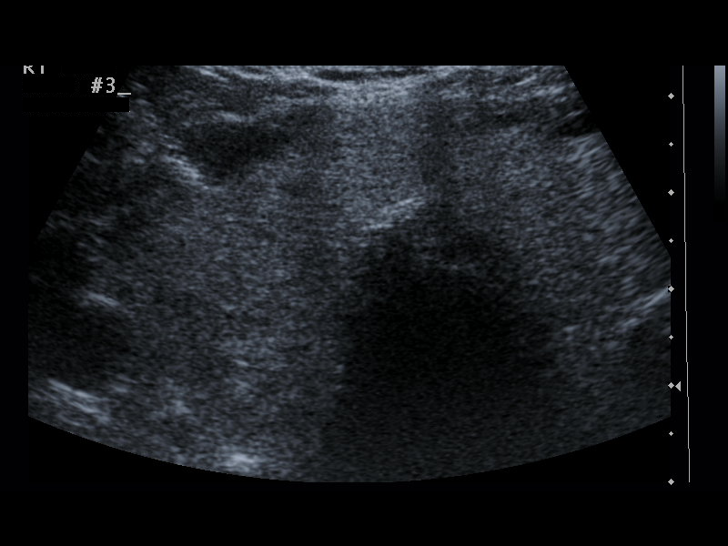
[im 12/14]
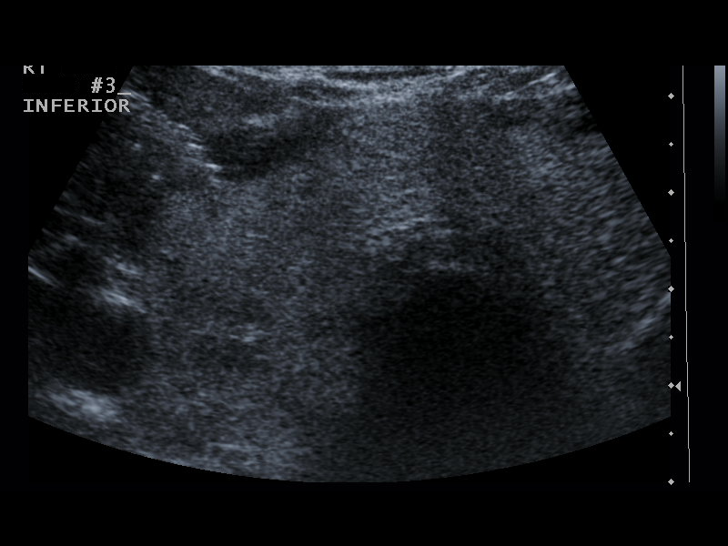
[im 13/14]
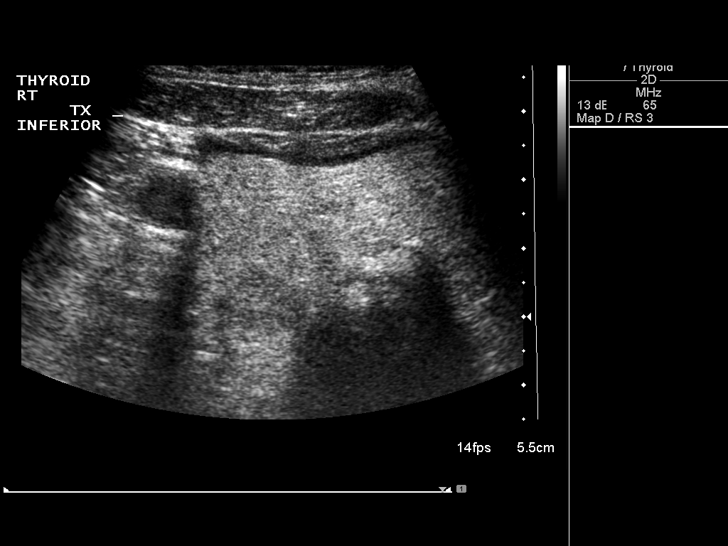
[im 14/14]
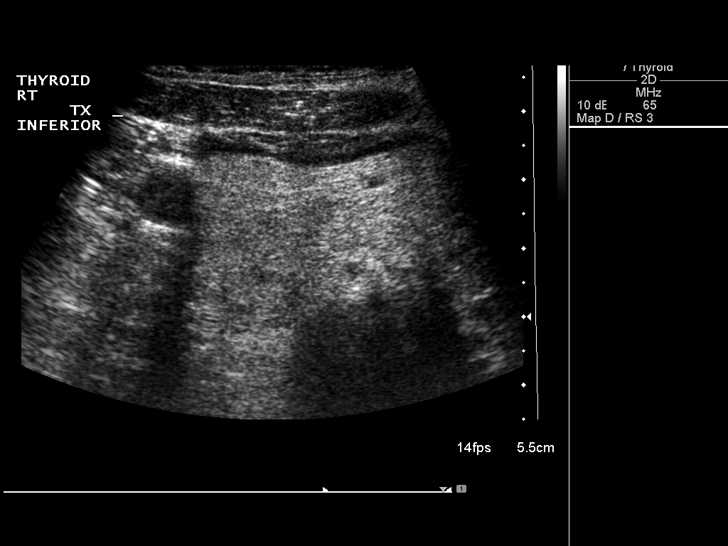

[14 of 14 positions shown; findings below may reference images not displayed]

FINDINGS: The images document guide needle placement within the
dominant right thyroid nodule. Post biopsy images demonstrate
image.
IMPRESSION: Successful ultrasound-guided fine needle aspiration of a dominant
right thyroid nodule

## 2014-03-13 ENCOUNTER — Ambulatory Visit
Admission: RE | Admit: 2014-03-13 | Discharge: 2014-03-13 | Disposition: A | Discharge status: Home / Self Care | Payer: Medicare Other | Source: Ambulatory Visit | Attending: Neurology | Admitting: Neurology

## 2014-03-13 DIAGNOSIS — M545 Low back pain, unspecified: Secondary | ICD-10-CM

## 2014-03-13 DIAGNOSIS — M79642 Pain in left hand: Secondary | ICD-10-CM

## 2014-03-13 DIAGNOSIS — G20C Parkinsonism, unspecified: Secondary | ICD-10-CM

## 2014-03-13 DIAGNOSIS — G20 Parkinson's disease: Secondary | ICD-10-CM

## 2014-03-13 DIAGNOSIS — G609 Hereditary and idiopathic neuropathy, unspecified: Secondary | ICD-10-CM

## 2014-03-14 ENCOUNTER — Other Ambulatory Visit: Payer: Self-pay | Admitting: Orthopedic Surgery

## 2014-03-17 NOTE — Progress Notes (Signed)
Quick Note:  Please call patient on with his MRI results: There are no specific findings that would explain parkinsonism. Nevertheless, there were some changes in the deeper structures of the brain, which we call white matter changes or microvascular changes. These were reported as mild in His case, L side a little more than R. These are tiny white spots, that occur with time and are seen in a variety of conditions, including with normal aging, chronic hypertension, chronic headaches, especially migraine HAs, chronic diabetes, chronic hyperlipidemia. These are not strokes and no mass or lesions were seen which is reassuring. 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 No intervention for this is needed at this time. Again, there were no acute findings, such as a stroke, or mass or blood products. No further action is required on this test at this time, other than re-enforcing the importance of good blood pressure control, good cholesterol control, good blood sugar control, and weight management. Please remind patient to keep any upcoming appointments or tests and to call us with any interim questions, concerns, problems or updates. Thanks,  Huston FoleySaima Estalene Bergey, MD, PhD    ______

## 2014-04-20 ENCOUNTER — Encounter (HOSPITAL_BASED_OUTPATIENT_CLINIC_OR_DEPARTMENT_OTHER): Payer: Self-pay | Admitting: *Deleted

## 2014-04-20 NOTE — Progress Notes (Signed)
Pt has moved back to [place]-he is self care-son here-pt will stay here post op- To bring all meds and cpap-has to use post op-will need istat and ekg

## 2014-04-25 ENCOUNTER — Encounter (HOSPITAL_BASED_OUTPATIENT_CLINIC_OR_DEPARTMENT_OTHER): Payer: Medicare Other | Admitting: Certified Registered"

## 2014-04-25 ENCOUNTER — Ambulatory Visit (HOSPITAL_BASED_OUTPATIENT_CLINIC_OR_DEPARTMENT_OTHER): Payer: Medicare Other | Admitting: Certified Registered"

## 2014-04-25 ENCOUNTER — Ambulatory Visit (HOSPITAL_BASED_OUTPATIENT_CLINIC_OR_DEPARTMENT_OTHER)
Admission: RE | Admit: 2014-04-25 | Discharge: 2014-04-25 | Disposition: A | Discharge status: Home / Self Care | Payer: Medicare Other | Source: Ambulatory Visit | Attending: Orthopedic Surgery | Admitting: Orthopedic Surgery

## 2014-04-25 ENCOUNTER — Encounter (HOSPITAL_BASED_OUTPATIENT_CLINIC_OR_DEPARTMENT_OTHER)
Admission: RE | Disposition: A | Discharge status: Home / Self Care | Payer: Self-pay | Source: Ambulatory Visit | Attending: Orthopedic Surgery

## 2014-04-25 ENCOUNTER — Encounter (HOSPITAL_BASED_OUTPATIENT_CLINIC_OR_DEPARTMENT_OTHER): Payer: Self-pay | Admitting: Orthopedic Surgery

## 2014-04-25 DIAGNOSIS — G20A1 Parkinson's disease without dyskinesia, without mention of fluctuations: Secondary | ICD-10-CM | POA: Insufficient documentation

## 2014-04-25 DIAGNOSIS — E119 Type 2 diabetes mellitus without complications: Secondary | ICD-10-CM | POA: Insufficient documentation

## 2014-04-25 DIAGNOSIS — G5620 Lesion of ulnar nerve, unspecified upper limb: Secondary | ICD-10-CM | POA: Insufficient documentation

## 2014-04-25 DIAGNOSIS — G20 Parkinson's disease: Secondary | ICD-10-CM | POA: Insufficient documentation

## 2014-04-25 DIAGNOSIS — I498 Other specified cardiac arrhythmias: Secondary | ICD-10-CM | POA: Insufficient documentation

## 2014-04-25 DIAGNOSIS — I10 Essential (primary) hypertension: Secondary | ICD-10-CM | POA: Insufficient documentation

## 2014-04-25 DIAGNOSIS — G4730 Sleep apnea, unspecified: Secondary | ICD-10-CM | POA: Insufficient documentation

## 2014-04-25 DIAGNOSIS — E669 Obesity, unspecified: Secondary | ICD-10-CM | POA: Insufficient documentation

## 2014-04-25 DIAGNOSIS — G5600 Carpal tunnel syndrome, unspecified upper limb: Secondary | ICD-10-CM | POA: Insufficient documentation

## 2014-04-25 DIAGNOSIS — I739 Peripheral vascular disease, unspecified: Secondary | ICD-10-CM | POA: Insufficient documentation

## 2014-04-25 HISTORY — PX: ULNAR NERVE TRANSPOSITION: SHX2595

## 2014-04-25 HISTORY — PX: CARPAL TUNNEL RELEASE: SHX101

## 2014-04-25 HISTORY — DX: Other specified postprocedural states: R11.2

## 2014-04-25 HISTORY — DX: Presence of spectacles and contact lenses: Z97.3

## 2014-04-25 HISTORY — DX: Sleep apnea, unspecified: G47.30

## 2014-04-25 HISTORY — DX: Other specified postprocedural states: Z98.890

## 2014-04-25 LAB — GLUCOSE, CAPILLARY: Glucose-Capillary: 127 mg/dL — ABNORMAL HIGH (ref 70–99)

## 2014-04-25 LAB — POCT I-STAT, CHEM 8
BUN: 23 mg/dL (ref 6–23)
CALCIUM ION: 1.09 mmol/L — AB (ref 1.13–1.30)
CREATININE: 1.1 mg/dL (ref 0.50–1.35)
Chloride: 104 mEq/L (ref 96–112)
Glucose, Bld: 125 mg/dL — ABNORMAL HIGH (ref 70–99)
HCT: 50 % (ref 39.0–52.0)
Hemoglobin: 17 g/dL (ref 13.0–17.0)
Potassium: 3.5 mEq/L — ABNORMAL LOW (ref 3.7–5.3)
Sodium: 142 mEq/L (ref 137–147)
TCO2: 24 mmol/L (ref 0–100)

## 2014-04-25 SURGERY — CARPAL TUNNEL RELEASE
Anesthesia: General | Site: Wrist | Laterality: Left

## 2014-04-25 MED ORDER — TRAMADOL HCL 50 MG PO TABS: 50.0000 mg | ORAL_TABLET | Freq: Four times a day (QID) | ORAL | Status: AC | PRN

## 2014-04-25 MED ORDER — CHLORHEXIDINE GLUCONATE 4 % EX LIQD: 60.0000 mL | Freq: Once | CUTANEOUS | Status: DC

## 2014-04-25 MED ORDER — OXYCODONE HCL 5 MG PO TABS: 5.0000 mg | ORAL_TABLET | Freq: Once | ORAL | Status: DC | PRN

## 2014-04-25 MED ORDER — PROPOFOL 10 MG/ML IV BOLUS
INTRAVENOUS | Status: DC | PRN
  Administered 2014-04-25: 150 mg via INTRAVENOUS

## 2014-04-25 MED ORDER — ONDANSETRON HCL 4 MG/2ML IJ SOLN
INTRAMUSCULAR | Status: DC | PRN
  Administered 2014-04-25: 4 mg via INTRAVENOUS

## 2014-04-25 MED ORDER — DEXAMETHASONE SODIUM PHOSPHATE 10 MG/ML IJ SOLN
INTRAMUSCULAR | Status: DC | PRN
  Administered 2014-04-25: 4 mg via INTRAVENOUS

## 2014-04-25 MED ORDER — CEFAZOLIN SODIUM-DEXTROSE 2-3 GM-% IV SOLR: 2.0000 g | INTRAVENOUS | Status: DC

## 2014-04-25 MED ORDER — BUPIVACAINE HCL (PF) 0.25 % IJ SOLN
INTRAMUSCULAR | Status: DC | PRN
  Administered 2014-04-25: 10 mL

## 2014-04-25 MED ORDER — CEFAZOLIN SODIUM-DEXTROSE 2-3 GM-% IV SOLR
INTRAVENOUS | Status: AC
  Filled 2014-04-25: qty 50

## 2014-04-25 MED ORDER — FENTANYL CITRATE 0.05 MG/ML IJ SOLN: 50.0000 ug | INTRAMUSCULAR | Status: DC | PRN

## 2014-04-25 MED ORDER — FENTANYL CITRATE 0.05 MG/ML IJ SOLN
INTRAMUSCULAR | Status: DC | PRN
  Administered 2014-04-25: 25 ug via INTRAVENOUS
  Administered 2014-04-25: 50 ug via INTRAVENOUS
  Administered 2014-04-25: 25 ug via INTRAVENOUS

## 2014-04-25 MED ORDER — LACTATED RINGERS IV SOLN
INTRAVENOUS | Status: DC
  Administered 2014-04-25 (×2): via INTRAVENOUS

## 2014-04-25 MED ORDER — FENTANYL CITRATE 0.05 MG/ML IJ SOLN
INTRAMUSCULAR | Status: AC
  Filled 2014-04-25: qty 6

## 2014-04-25 MED ORDER — PROPOFOL 10 MG/ML IV EMUL
INTRAVENOUS | Status: AC
  Filled 2014-04-25: qty 50

## 2014-04-25 MED ORDER — MIDAZOLAM HCL 2 MG/2ML IJ SOLN: 1.0000 mg | INTRAMUSCULAR | Status: DC | PRN

## 2014-04-25 MED ORDER — CEFAZOLIN SODIUM-DEXTROSE 2-3 GM-% IV SOLR
2.0000 g | INTRAVENOUS | Status: AC
  Administered 2014-04-25: 2 g via INTRAVENOUS

## 2014-04-25 MED ORDER — PROMETHAZINE HCL 25 MG/ML IJ SOLN: 6.2500 mg | INTRAMUSCULAR | Status: DC | PRN

## 2014-04-25 MED ORDER — HYDROMORPHONE HCL PF 1 MG/ML IJ SOLN: 0.2500 mg | INTRAMUSCULAR | Status: DC | PRN

## 2014-04-25 MED ORDER — LIDOCAINE HCL (CARDIAC) 20 MG/ML IV SOLN
INTRAVENOUS | Status: DC | PRN
  Administered 2014-04-25: 60 mg via INTRAVENOUS

## 2014-04-25 MED ORDER — EPHEDRINE SULFATE 50 MG/ML IJ SOLN
INTRAMUSCULAR | Status: DC | PRN
  Administered 2014-04-25: 10 mg via INTRAVENOUS

## 2014-04-25 MED ORDER — BUPIVACAINE HCL (PF) 0.25 % IJ SOLN
INTRAMUSCULAR | Status: AC
  Filled 2014-04-25: qty 30

## 2014-04-25 MED ORDER — MEPERIDINE HCL 25 MG/ML IJ SOLN: 6.2500 mg | INTRAMUSCULAR | Status: DC | PRN

## 2014-04-25 MED ORDER — OXYCODONE HCL 5 MG/5ML PO SOLN: 5.0000 mg | Freq: Once | ORAL | Status: DC | PRN

## 2014-04-25 SURGICAL SUPPLY — 48 items
BLADE MINI RND TIP GREEN BEAV (BLADE) IMPLANT
BLADE SURG 15 STRL LF DISP TIS (BLADE) ×2 IMPLANT
BLADE SURG 15 STRL SS (BLADE) ×4
BNDG COHESIVE 3X5 TAN STRL LF (GAUZE/BANDAGES/DRESSINGS) ×3 IMPLANT
BNDG ESMARK 4X9 LF (GAUZE/BANDAGES/DRESSINGS) ×3 IMPLANT
BNDG GAUZE ELAST 4 BULKY (GAUZE/BANDAGES/DRESSINGS) ×3 IMPLANT
CHLORAPREP W/TINT 26ML (MISCELLANEOUS) ×3 IMPLANT
CORDS BIPOLAR (ELECTRODE) ×3 IMPLANT
COVER MAYO STAND STRL (DRAPES) ×3 IMPLANT
COVER TABLE BACK 60X90 (DRAPES) ×3 IMPLANT
CUFF TOURNIQUET SINGLE 18IN (TOURNIQUET CUFF) ×3 IMPLANT
DECANTER SPIKE VIAL GLASS SM (MISCELLANEOUS) IMPLANT
DRAPE EXTREMITY T 121X128X90 (DRAPE) ×3 IMPLANT
DRAPE SURG 17X23 STRL (DRAPES) ×3 IMPLANT
DRSG PAD ABDOMINAL 8X10 ST (GAUZE/BANDAGES/DRESSINGS) ×6 IMPLANT
GAUZE SPONGE 4X4 12PLY STRL (GAUZE/BANDAGES/DRESSINGS) ×3 IMPLANT
GAUZE SPONGE 4X4 16PLY XRAY LF (GAUZE/BANDAGES/DRESSINGS) IMPLANT
GAUZE XEROFORM 1X8 LF (GAUZE/BANDAGES/DRESSINGS) ×3 IMPLANT
GLOVE BIOGEL PI IND STRL 8.5 (GLOVE) ×1 IMPLANT
GLOVE BIOGEL PI INDICATOR 8.5 (GLOVE) ×2
GLOVE SURG ORTHO 8.0 STRL STRW (GLOVE) ×3 IMPLANT
GOWN STRL REUS W/ TWL LRG LVL3 (GOWN DISPOSABLE) ×1 IMPLANT
GOWN STRL REUS W/TWL LRG LVL3 (GOWN DISPOSABLE) ×2
GOWN STRL REUS W/TWL XL LVL3 (GOWN DISPOSABLE) ×6 IMPLANT
LOOP VESSEL MAXI BLUE (MISCELLANEOUS) IMPLANT
NDL SUT 6 .5 CRC .975X.05 MAYO (NEEDLE) IMPLANT
NEEDLE 27GAX1X1/2 (NEEDLE) ×3 IMPLANT
NEEDLE MAYO TAPER (NEEDLE)
NS IRRIG 1000ML POUR BTL (IV SOLUTION) ×3 IMPLANT
PACK BASIN DAY SURGERY FS (CUSTOM PROCEDURE TRAY) ×3 IMPLANT
PAD CAST 3X4 CTTN HI CHSV (CAST SUPPLIES) ×1 IMPLANT
PAD CAST 4YDX4 CTTN HI CHSV (CAST SUPPLIES) ×1 IMPLANT
PADDING CAST ABS 4INX4YD NS (CAST SUPPLIES) ×2
PADDING CAST ABS COTTON 4X4 ST (CAST SUPPLIES) ×1 IMPLANT
PADDING CAST COTTON 3X4 STRL (CAST SUPPLIES) ×2
PADDING CAST COTTON 4X4 STRL (CAST SUPPLIES) ×2
SLEEVE SCD COMPRESS KNEE MED (MISCELLANEOUS) ×3 IMPLANT
SPLINT PLASTER CAST XFAST 3X15 (CAST SUPPLIES) IMPLANT
SPLINT PLASTER XTRA FASTSET 3X (CAST SUPPLIES)
STOCKINETTE 4X48 STRL (DRAPES) ×3 IMPLANT
SUT VIC AB 2-0 SH 27 (SUTURE) ×2
SUT VIC AB 2-0 SH 27XBRD (SUTURE) ×1 IMPLANT
SUT VICRYL 4-0 PS2 18IN ABS (SUTURE) ×3 IMPLANT
SUT VICRYL RAPIDE 4/0 PS 2 (SUTURE) ×3 IMPLANT
SYR BULB 3OZ (MISCELLANEOUS) ×3 IMPLANT
SYR CONTROL 10ML LL (SYRINGE) ×3 IMPLANT
TOWEL OR 17X24 6PK STRL BLUE (TOWEL DISPOSABLE) ×6 IMPLANT
UNDERPAD 30X30 INCONTINENT (UNDERPADS AND DIAPERS) ×3 IMPLANT

## 2014-04-25 NOTE — Brief Op Note (Signed)
04/25/2014  9:42 AM  PATIENT:  Darren Wallace  73 y.o. male  PRE-OPERATIVE DIAGNOSIS:  CARPATL TUNNEL SYNDROME LEFT,CUBITAL TUNNEL LEFT  POST-OPERATIVE DIAGNOSIS:  CARPATL TUNNEL SYNDROME LEFT,CUBITAL TUNNEL LEFT  PROCEDURE:  Procedure(s): LEFT CARPAL TUNNEL RELEASE,DECOMPRESSION POSSIBLE TRANSPOSITION LEFT ULNAR NERVE (Left) ULNAR NERVE DECOMPRESSION/TRANSPOSITION (Left)  SURGEON:  Surgeon(s) and Role:    * Nicki ReaperGary R Riana Tessmer, MD - Primary    * Tami RibasKevin R Elspeth Blucher, MD - Assisting  PHYSICIAN ASSISTANT:   ASSISTANTS: K Amory Simonetti,MD   ANESTHESIA:   local and general  EBL:  Total I/O In: 1000 [I.V.:1000] Out: -   BLOOD ADMINISTERED:none  DRAINS: none   LOCAL MEDICATIONS USED:  BUPIVICAINE   SPECIMEN:  No Specimen  DISPOSITION OF SPECIMEN:  N/A  COUNTS:  YES  TOURNIQUET:   Total Tourniquet Time Documented: Upper Arm (Left) - 30 minutes Total: Upper Arm (Left) - 30 minutes   DICTATION: .Other Dictation: Dictation Number [id number]  PLAN OF CARE: Discharge to home after PACU  PATIENT DISPOSITION:  PACU - hemodynamically stable.

## 2014-04-25 NOTE — Anesthesia Preprocedure Evaluation (Addendum)
Anesthesia Evaluation  Patient identified by MRN, date of birth, ID band Patient awake    Reviewed: Allergy & Precautions, H&P , NPO status , Patient's Chart, lab work & pertinent test results, reviewed documented beta blocker date and time   History of Anesthesia Complications (+) PONV and history of anesthetic complications  Airway Mallampati: I TM Distance: >3 FB Neck ROM: Full    Dental  (+) Missing, Dental Advisory Given   Pulmonary shortness of breath, sleep apnea and Continuous Positive Airway Pressure Ventilation ,  breath sounds clear to auscultation  Pulmonary exam normal       Cardiovascular hypertension, Pt. on home beta blockers - angina+ Peripheral Vascular Disease Rhythm:Regular Rate:Bradycardia     Neuro/Psych negative neurological ROS     GI/Hepatic negative GI ROS, Neg liver ROS,   Endo/Other  diabetes, Type 2, Oral Hypoglycemic Agents  Renal/GU negative Renal ROS     Musculoskeletal   Abdominal (+) + obese,   Peds  Hematology negative hematology ROS (+)   Anesthesia Other Findings   Reproductive/Obstetrics                          Anesthesia Physical Anesthesia Plan  ASA: III  Anesthesia Plan: General   Post-op Pain Management:    Induction: Intravenous  Airway Management Planned: LMA  Additional Equipment:   Intra-op Plan:   Post-operative Plan:   Informed Consent: I have reviewed the patients History and Physical, chart, labs and discussed the procedure including the risks, benefits and alternatives for the proposed anesthesia with the patient or authorized representative who has indicated his/her understanding and acceptance.   Dental advisory given  Plan Discussed with: Surgeon and CRNA  Anesthesia Plan Comments: (Plan routine monitors, GA- LMA OK)        Anesthesia Quick Evaluation

## 2014-04-25 NOTE — Anesthesia Postprocedure Evaluation (Signed)
  Anesthesia Post-op Note  Patient: Darren Wallace  Procedure(s) Performed: Procedure(s): LEFT CARPAL TUNNEL RELEASE,DECOMPRESSION POSSIBLE TRANSPOSITION LEFT ULNAR NERVE (Left) ULNAR NERVE DECOMPRESSION/TRANSPOSITION (Left)  Patient Location: PACU  Anesthesia Type:General  Level of Consciousness: awake, alert , oriented and patient cooperative  Airway and Oxygen Therapy: Patient Spontanous Breathing  Post-op Pain: none  Post-op Assessment: Post-op Vital signs reviewed, Patient's Cardiovascular Status Stable, Respiratory Function Stable, Patent Airway, No signs of Nausea or vomiting and Pain level controlled  Post-op Vital Signs: Reviewed and stable  Last Vitals:  Filed Vitals:   04/25/14 1030  BP: 147/72  Pulse: 63  Temp: 36.4 C  Resp: 18    Complications: No apparent anesthesia complications

## 2014-04-25 NOTE — Op Note (Signed)
NAME:  Darren Wallace, THOMAS               ACCOUNT NO.:  [id number]  MEDICAL RECORD NO.:  [id number]  LOCATION:                                 FACILITY:  PHYSICIAN:  Cindee SaltGary Kaimana Lurz, M.D.            DATE OF BIRTH:  DATE OF PROCEDURE:  04/25/2014 DATE OF DISCHARGE:                              OPERATIVE REPORT   PREOPERATIVE DIAGNOSES:  Carpal tunnel syndrome, cubital tunnel syndrome, left arm.  POSTOPERATIVE DIAGNOSES:  Carpal tunnel syndrome, cubital tunnel syndrome, left arm.  OPERATIONS:  Decompression of median nerve at the wrist, decompression of ulnar nerve at the elbow.  SURGEON:  Cindee SaltGary Mella Inclan, MD  ASSISTANT:  Betha LoaKevin Congetta Odriscoll, MD  ANESTHESIA:  General with local infiltration.  ANESTHESIOLOGIST:  Dr. Jean RosenthalJackson.  HISTORY:  The patient is a [age] year old male with a history of numbness and tingling of his left hand, all fingers.  Nerve conductions are positive for carpal tunnel and cubital tunnel syndrome.  He has elected to undergo surgical decompression of each.  Pre, para, postoperative course have been discussed along with risks and complications.  He is aware that there is no guarantee with the surgery, possibility of infection; recurrence of injury to arteries, nerves, tendons, relief of symptoms, dystrophy.  In the preoperative area, the patient is seen, the extremity marked by both the patient and surgeon.  Antibiotic given.  PROCEDURE IN DETAIL:  The patient was brought to the operating room, where general anesthetic was carried out without difficulty.  He was prepped using ChloraPrep in supine position with the left arm free.  A 3- minute dry time was allowed.  Time-out taken confirming the patient and procedure.  The limb was exsanguinated with an Esmarch bandage. Tourniquet was placed high, and the arm was inflated to 250 mmHg.  A longitudinal incision was made in the left palm, carried down through subcutaneous tissue.  Bleeders were electrocauterized.  Palmar fascia was  split.  Superficial palmar arch identified.  The flexor tendon to the ring and little finger identified.  Retractors placed.  The flexor retinaculum was then incised on its ulnar border with protection of both median and ulnar nerves.  Right angle and Sewall retractor placed between skin and forearm fascia.  The fascia released for approximately 2 cm proximal to the wrist crease under direct vision.  Canal was explored.  Air compression to the nerve was apparent.  Motor branch entered in the muscle.  No further lesions were identified.  The wound was irrigated and closed with interrupted 4-0 Vicryl Rapide sutures. Separate incision was then made over the medial epicondyle of his left elbow and carried down through subcutaneous tissue.  Bleeders again electrocauterized with bipolar.  The posterior branches of the medial antebrachial cutaneous nerve of the forearm were identified.  The dissection was carried down to [employer] fascia.  This was released on its posterior aspect.  The subcutaneous tissue was dissected free from the fascia overlying the flexor carpi ulnaris.  This was then released after placement of 2 new retractors.  The muscle was then split.  A KMI guide for carpal tunnel release was then inserted between the deep fascia and the  ulnar nerve distally, and a release performed distally for approximately 6-7 cm distally.  Using an angled ENT straight scissors, the of proximal fascia was then dissected free from the antebrachial fascia and subcutaneous tissue.  The knee retractors again placed proximally.  The fascia was then released proximally.  The deep fascia was then released after placement of the [facility] guide between the ulnar nerve proximally and the deep fascia.  This was done again with the ENT angled scissors, the entire nerve was released proximally and distally for approximately 12 cm proximally and distally.  The elbow fully flexed.  The nerve did not anterior  translocate.  The wound was copiously irrigated with saline.  Osborne fascia was then sutured to the posterior skin fold with figure-of-eight 2-0 Vicryl sutures. Subcutaneous tissue closed with interrupted 2-0 Vicryl and the skin with interrupted 4-0 Vicryl Rapide sutures.  Local infiltration at each of the incisions was given, 10 mL of bupivacaine was used 0.25% without epinephrine.  A sterile compressive dressing including the wrist and elbow was applied.  Deflation of the tourniquet, all fingers immediately pinked.  He was taken to the recovery room for observation in satisfactory condition.  He will be discharged home to [facility] [place] [facility] [place] [facility].          ______________________________ Cindee SaltGary Katianne Barre, M.D.     GK/MEDQ  D:  04/25/2014  T:  04/25/2014  Job:  [id number]

## 2014-04-25 NOTE — Transfer of Care (Signed)
Immediate Anesthesia Transfer of Care Note  Patient: Darren Wallace  Procedure(s) Performed: Procedure(s): LEFT CARPAL TUNNEL RELEASE,DECOMPRESSION POSSIBLE TRANSPOSITION LEFT ULNAR NERVE (Left) ULNAR NERVE DECOMPRESSION/TRANSPOSITION (Left)  Patient Location: PACU  Anesthesia Type:General  Level of Consciousness: awake and patient cooperative  Airway & Oxygen Therapy: Patient Spontanous Breathing and Patient connected to face mask oxygen  Post-op Assessment: Report given to PACU RN and Post -op Vital signs reviewed and stable  Post vital signs: Reviewed and stable  Complications: No apparent anesthesia complications

## 2014-04-25 NOTE — Op Note (Signed)
Dictation Number [id number]

## 2014-04-25 NOTE — H&P (Signed)
Darren Wallace is a 73 year old right hand dominant male complaining of his left hand going to sleep, especially at night. It wakes him up 7 out of 7 nights. This has been going on for the past 6 weeks. He states the whole hand is numb. He gets up, shakes it and walks around with it hanging down and frequently gets better. It then recurs when he lies down. He has no history of injury to the hand or neck but does have a history of diabetes, thyroid problems and gout. He complains of intermittent, severe, sharp, throbbing pain with a feeling of numbness. It has not significantly changed. He has not had any treatment for this. He has recently seen a neurologist who felt that he has Parkinson's. 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 His motor delay is 8.3/left, 5.2/right to the median nerve.  Sensory delay is 3.4/left and 2.8/right.  His amplitude diminution is 7.3/left and 36.7/right.  He shows conduction velocity changes on his ulnar nerve at the elbow 48/left and 30.8/right with wrist stimulation in the palm of 1.7/left and 3.0/right with a diminution amplitude on his right side at 5.3.  PAST MEDICAL HISTORY: He is allergic to Morphine. He is on Allopurinol, metformin, atenolol, Flomax, benazepril, and multivitamins. He has been on Coumadin in the past. He has had low back surgery done by Dr. Ophelia CharterYates, kidney stone removal, hernia repair and skin cancer.  FAMILY H ISTORY: Positive for diabetes, heart disease and high BP.  SOCIAL HISTORY: He does not smoke or drink. He is widowed and retired.  REVIEW OF SYSTEMS: Positive for cancer, cataracts, glasses, high BP, blood clots, balance problems. Darren Wallace is an 73 y.o. male.   Chief Complaint: carpal tunnel and cubital tunnel left HPI: see above  Past Medical History  Diagnosis Date  . Hypertrophy of prostate without urinary  obstruction and other lower urinary tract symptoms (LUTS)   . Long term (current) use of anticoagulants   . Gout, unspecified   . Pure hypercholesterolemia   . Essential hypertension, benign   . Unspecified hereditary and idiopathic peripheral neuropathy   . Nonspecific abnormal finding in stool contents   . Acute venous embolism and thrombosis of unspecified deep vessels of lower extremity   . Personal history of colonic polyps   . Unspecified essential hypertension   . Occlusion and stenosis of carotid artery without mention of cerebral infarction   . Need for prophylactic vaccination and inoculation against influenza   . Type II or unspecified type diabetes mellitus without mention of complication, uncontrolled   . Other general symptoms(780.99)     rigidity  . Pain in limb   . Other dyspnea and respiratory abnormality   . Disturbance of skin sensation     Left hand numbness  . Unspecified urinary incontinence   . Unspecified essential hypertension   . Parkinsonism 02/23/2014  . PONV (postoperative nausea and vomiting)     also memory issues with morphine  . Wears glasses   . Sleep apnea     uses a cpap    Past Surgical History  Procedure Laterality Date  . Umbilical hernia repair  06/2007  . Skin biopsy      multiple  . Lumbar spine surgery  2011  . Colonoscopy  04/29/2011  . Lithotripsy    . Cystoscopy w/ stone manipulation  2000  several    Family History  Problem Relation Age of Onset  . Cancer Mother   . Cancer Father   . Cancer Sister   . Cancer Brother   . Cancer Maternal Grandmother    Social History:  reports that he has never smoked. He does not have any smokeless tobacco history on file. He reports that he does not drink alcohol or use illicit drugs.  Allergies:  Allergies  Allergen Reactions  . Morphine And Related Other (See Comments)    Hallucinations; makes him "crazy"  . Gadolinium Derivatives     No prescriptions prior to admission     No results found for this or any previous visit (from the past 48 hour(s)).  No results found.   Pertinent items are noted in HPI.  Height 5' 5.5" (1.664 m), weight 81.647 kg (180 lb).  General appearance: alert, cooperative and appears stated age Head: Normocephalic, without obvious abnormality Neck: no JVD Resp: clear to auscultation bilaterally Cardio: regular rate and rhythm, S1, S2 normal, no murmur, click, rub or gallop GI: soft, non-tender; bowel sounds normal; no masses,  no organomegaly Extremities: extremities normal, atraumatic, no cyanosis or edema Pulses: 2+ and symmetric Skin: Skin color, texture, turgor normal. No rashes or lesions Neurologic: Grossly normal Incision/Wound: na  Assessment/Plan We have discussed the possibility of surgical decompression to the carpal canal bilaterally with release of Guyon's canal on his right side along with cubital tunnel releases on each side.    The pre, peri and postoperative course were discussed along with the risks and complications.  The patient is aware there is no guarantee with the surgery, possibility of infection, recurrence, injury to arteries, nerves, tendons, incomplete relief of symptoms and dystrophy.  He is advised of the possibility of transposition to the ulnar nerve if subluxation dislocation occurs following decompression.  He would like to proceed for carpal tunnel left side along with cubital tunnel release, possible transposition left side.  He is seen with his son, questions are encouraged and answered.   Nicki ReaperGary R Mirah Nevins 04/25/2014, 5:31 AM

## 2014-04-25 NOTE — Anesthesia Procedure Notes (Signed)
Procedure Name: LMA Insertion Date/Time: 04/25/2014 8:46 AM Performed by: Filomena Pokorney Pre-anesthesia Checklist: Patient identified, Emergency Drugs available, Suction available and Patient being monitored Patient Re-evaluated:Patient Re-evaluated prior to inductionOxygen Delivery Method: Circle System Utilized Preoxygenation: Pre-oxygenation with 100% oxygen Intubation Type: IV induction Ventilation: Mask ventilation without difficulty LMA: LMA inserted LMA Size: 4.0 Number of attempts: 1 Airway Equipment and Method: bite block Placement Confirmation: positive ETCO2 Tube secured with: Tape Dental Injury: Teeth and Oropharynx as per pre-operative assessment

## 2014-04-25 NOTE — Discharge Instructions (Addendum)

## 2014-04-27 ENCOUNTER — Encounter (HOSPITAL_BASED_OUTPATIENT_CLINIC_OR_DEPARTMENT_OTHER): Payer: Self-pay | Admitting: Orthopedic Surgery

## 2014-09-01 ENCOUNTER — Ambulatory Visit: Payer: Medicare Other | Admitting: Neurology

## 2016-07-28 ENCOUNTER — Telehealth: Payer: Self-pay | Admitting: *Deleted

## 2016-07-28 NOTE — Telephone Encounter (Signed)
Pt medical records faxed to Dr. Kathrin PennerNicole Calakos @ [phone] # [phone]

## 2020-04-07 DEATH — deceased
